# Patient Record
Sex: Female | Born: 1980 | Race: White | Hispanic: No | Marital: Single | Smoking: Never smoker
Health system: Southern US, Community
[De-identification: ages and names within clinical notes are randomized; demographics above are authoritative.]

## PROBLEM LIST (undated history)

## (undated) DIAGNOSIS — D649 Anemia, unspecified: Secondary | ICD-10-CM

## (undated) DIAGNOSIS — G8929 Other chronic pain: Secondary | ICD-10-CM

## (undated) DIAGNOSIS — R918 Other nonspecific abnormal finding of lung field: Secondary | ICD-10-CM

## (undated) DIAGNOSIS — G2581 Restless legs syndrome: Secondary | ICD-10-CM

## (undated) DIAGNOSIS — E785 Hyperlipidemia, unspecified: Secondary | ICD-10-CM

## (undated) DIAGNOSIS — IMO0002 Reserved for concepts with insufficient information to code with codable children: Secondary | ICD-10-CM

## (undated) DIAGNOSIS — F909 Attention-deficit hyperactivity disorder, unspecified type: Secondary | ICD-10-CM

## (undated) DIAGNOSIS — M549 Dorsalgia, unspecified: Secondary | ICD-10-CM

## (undated) DIAGNOSIS — K589 Irritable bowel syndrome without diarrhea: Secondary | ICD-10-CM

## (undated) DIAGNOSIS — K219 Gastro-esophageal reflux disease without esophagitis: Secondary | ICD-10-CM

## (undated) DIAGNOSIS — G459 Transient cerebral ischemic attack, unspecified: Secondary | ICD-10-CM

## (undated) DIAGNOSIS — IMO0001 Reserved for inherently not codable concepts without codable children: Secondary | ICD-10-CM

## (undated) DIAGNOSIS — M542 Cervicalgia: Secondary | ICD-10-CM

## (undated) HISTORY — PX: KIDNEY STONE SURGERY: SHX686

## (undated) HISTORY — PX: OTHER SURGICAL HISTORY: SHX169

## (undated) HISTORY — PX: TUBAL LIGATION: SHX77

## (undated) HISTORY — PX: CHOLECYSTECTOMY: SHX55

---

## 2011-06-07 ENCOUNTER — Emergency Department (HOSPITAL_BASED_OUTPATIENT_CLINIC_OR_DEPARTMENT_OTHER)
Admission: EM | Admit: 2011-06-07 | Discharge: 2011-06-07 | Disposition: A | Discharge status: Home / Self Care | Payer: Medicaid Other | Attending: Emergency Medicine | Admitting: Emergency Medicine

## 2011-06-07 ENCOUNTER — Other Ambulatory Visit: Payer: Self-pay

## 2011-06-07 ENCOUNTER — Encounter (HOSPITAL_BASED_OUTPATIENT_CLINIC_OR_DEPARTMENT_OTHER): Payer: Self-pay | Admitting: *Deleted

## 2011-06-07 DIAGNOSIS — K589 Irritable bowel syndrome without diarrhea: Secondary | ICD-10-CM | POA: Insufficient documentation

## 2011-06-07 DIAGNOSIS — G8929 Other chronic pain: Secondary | ICD-10-CM | POA: Insufficient documentation

## 2011-06-07 DIAGNOSIS — J45909 Unspecified asthma, uncomplicated: Secondary | ICD-10-CM | POA: Insufficient documentation

## 2011-06-07 DIAGNOSIS — Z79899 Other long term (current) drug therapy: Secondary | ICD-10-CM | POA: Insufficient documentation

## 2011-06-07 DIAGNOSIS — IMO0002 Reserved for concepts with insufficient information to code with codable children: Secondary | ICD-10-CM | POA: Insufficient documentation

## 2011-06-07 DIAGNOSIS — F988 Other specified behavioral and emotional disorders with onset usually occurring in childhood and adolescence: Secondary | ICD-10-CM | POA: Insufficient documentation

## 2011-06-07 DIAGNOSIS — R55 Syncope and collapse: Secondary | ICD-10-CM | POA: Insufficient documentation

## 2011-06-07 DIAGNOSIS — K219 Gastro-esophageal reflux disease without esophagitis: Secondary | ICD-10-CM | POA: Insufficient documentation

## 2011-06-07 DIAGNOSIS — R42 Dizziness and giddiness: Secondary | ICD-10-CM | POA: Insufficient documentation

## 2011-06-07 HISTORY — DX: Irritable bowel syndrome, unspecified: K58.9

## 2011-06-07 HISTORY — DX: Anemia, unspecified: D64.9

## 2011-06-07 HISTORY — DX: Reserved for concepts with insufficient information to code with codable children: IMO0002

## 2011-06-07 HISTORY — DX: Reserved for inherently not codable concepts without codable children: IMO0001

## 2011-06-07 HISTORY — DX: Hyperlipidemia, unspecified: E78.5

## 2011-06-07 HISTORY — DX: Other chronic pain: G89.29

## 2011-06-07 HISTORY — DX: Cervicalgia: M54.2

## 2011-06-07 HISTORY — DX: Dorsalgia, unspecified: M54.9

## 2011-06-07 HISTORY — DX: Restless legs syndrome: G25.81

## 2011-06-07 HISTORY — DX: Gastro-esophageal reflux disease without esophagitis: K21.9

## 2011-06-07 HISTORY — DX: Other nonspecific abnormal finding of lung field: R91.8

## 2011-06-07 HISTORY — DX: Attention-deficit hyperactivity disorder, unspecified type: F90.9

## 2011-06-07 LAB — URINE MICROSCOPIC-ADD ON

## 2011-06-07 LAB — URINALYSIS, ROUTINE W REFLEX MICROSCOPIC
Bilirubin Urine: NEGATIVE
Protein, ur: NEGATIVE mg/dL
Urobilinogen, UA: 1 mg/dL (ref 0.0–1.0)

## 2011-06-07 MED ORDER — SODIUM CHLORIDE 0.9 % IV BOLUS (SEPSIS)
1000.0000 mL | Freq: Once | INTRAVENOUS | Status: AC
  Administered 2011-06-07: 1000 mL via INTRAVENOUS

## 2011-06-07 NOTE — ED Notes (Signed)
Secondary Assessment- Pt reports she was recently admitted and d/c'd from [facility] for a syncopal episode.  She was at a follow up appointment with PMD today and had a near syncopal episode.  She reports dizziness when standing, generalized weakness and "coolness to left foot and leg". Palpable pedal pulse noted and foot is noted to be cooler than opposite foot.  Per EMS pt was hypotensive on scene and received IVF PTA.

## 2011-06-07 NOTE — ED Provider Notes (Signed)
History     CSN: [id number]  Arrival date & time 06/07/11  1656   First MD Initiated Contact with Patient 06/07/11 1729      Chief Complaint  Patient presents with  . Dizziness    (Consider location/radiation/quality/duration/timing/severity/associated sxs/prior treatment) HPI Comments: History of syncope. His had numerous tests done during recent admission to rule out the cause. Had near syncopal event at her doctor's office where her blood pressure became low. She is not orthostatic. Denies chest pain, shortness of breath, abdominal pain, nausea, vomiting  Patient is a 31 y.o. female presenting with syncope. The history is provided by the patient. No language interpreter was used.  Loss of Consciousness This is a new problem. The current episode started 1 to 2 hours ago. The problem occurs constantly. The problem has been rapidly improving. Pertinent negatives include no chest pain, no abdominal pain, no headaches and no shortness of breath. The symptoms are aggravated by nothing. The symptoms are relieved by nothing. She has tried nothing for the symptoms.    Past Medical History  Diagnosis Date  . Degenerative disc disease   . Reflux   . Attention deficit hyperactivity disorder   . Chronic back pain   . Chronic neck pain   . Asthma   . Chronic pain   . Anemia   . Restless leg syndrome   . Migraine   . Irritable bowel syndrome (IBS)   . Hyperlipemia   . Lung mass     No past surgical history on file.  No family history on file.  History  Substance Use Topics  . Smoking status: Not on file  . Smokeless tobacco: Not on file  . Alcohol Use:     OB History    Grav Para Term Preterm Abortions TAB SAB Ect Mult Living                  Review of Systems  Constitutional: Negative for fever, activity change, appetite change and fatigue.  HENT: Negative for congestion, sore throat, rhinorrhea, neck pain and neck stiffness.   Respiratory: Negative for cough and  shortness of breath.   Cardiovascular: Positive for syncope. Negative for chest pain and palpitations.  Gastrointestinal: Negative for nausea, vomiting and abdominal pain.  Genitourinary: Negative for dysuria, urgency, frequency and flank pain.  Musculoskeletal: Negative for myalgias, back pain and arthralgias.  Neurological: Positive for light-headedness. Negative for dizziness, weakness, numbness and headaches.  All other systems reviewed and are negative.    Allergies  Milk-related compounds and Latex  Home Medications   Current Outpatient Rx  Name Route Sig Dispense Refill  . ALBUTEROL SULFATE HFA 108 (90 BASE) MCG/ACT IN AERS Inhalation Inhale 2 puffs into the lungs every 4 (four) hours as needed. For cough or wheezing    . CYANOCOBALAMIN 1000 MCG/ML IJ SOLN Intramuscular Inject 1,000 mcg into the muscle every 30 (thirty) days.    . DULOXETINE HCL 20 MG PO CPEP Oral Take 20 mg by mouth 2 (two) times daily.    Marland Kitchen FLUTICASONE-SALMETEROL 250-50 MCG/DOSE IN AEPB Inhalation Inhale 1 puff into the lungs every 12 (twelve) hours.    Marland Kitchen HYDROCODONE-ACETAMINOPHEN 7.5-650 MG PO TABS Oral Take 0.5-1 tablets by mouth 3 (three) times daily as needed. For pain    . METHYLPHENIDATE HCL 10 MG PO TABS Oral Take 10 mg by mouth 3 (three) times daily as needed. For concentration    . NORTRIPTYLINE HCL 10 MG PO CAPS Oral Take 20 mg by  mouth at bedtime.    . OMEPRAZOLE 40 MG PO CPDR Oral Take 40 mg by mouth at bedtime.    . SUMATRIPTAN SUCCINATE 100 MG PO TABS Oral Take 100 mg by mouth every 2 (two) hours as needed. For migraine    . VITAMIN B-12 1000 MCG PO TABS Oral Take 2,000 mcg by mouth daily.       BP 115/99  Pulse 86  Temp(Src) 97.5 F (36.4 C) (Oral)  Resp 24  Ht 4' 11.5" (1.511 m)  Wt 94 lb (42.638 kg)  BMI 18.67 kg/m2  SpO2 100%  Physical Exam  Nursing note and vitals reviewed. Constitutional: She is oriented to person, place, and time. She appears well-developed and well-nourished. No  distress.  HENT:  Head: Normocephalic and atraumatic.  Mouth/Throat: Oropharynx is clear and moist.  Eyes: Conjunctivae and EOM are normal. Pupils are equal, round, and reactive to light.  Neck: Normal range of motion. Neck supple.  Cardiovascular: Normal rate, regular rhythm, normal heart sounds and intact distal pulses.  Exam reveals no gallop and no friction rub.   No murmur heard. Pulmonary/Chest: Effort normal and breath sounds normal. No respiratory distress.  Abdominal: Soft. Bowel sounds are normal. There is no tenderness.  Musculoskeletal: Normal range of motion. She exhibits no tenderness.  Neurological: She is alert and oriented to person, place, and time. No cranial nerve deficit.  Skin: Skin is warm and dry. No rash noted.    ED Course  Procedures (including critical care time)   Date: 06/07/2011  Rate: 75  Rhythm: normal sinus rhythm  QRS Axis: normal  Intervals: normal  ST/T Wave abnormalities: normal  Conduction Disutrbances:none  Narrative Interpretation:   Old EKG Reviewed: none available  Labs Reviewed  URINALYSIS, ROUTINE W REFLEX MICROSCOPIC - Abnormal; Notable for the following:    APPearance TURBID (*)    pH 8.5 (*)    Hgb urine dipstick LARGE (*)    Ketones, ur 15 (*)    All other components within normal limits  URINE MICROSCOPIC-ADD ON - Abnormal; Notable for the following:    Squamous Epithelial / LPF FEW (*)    Bacteria, UA FEW (*)    All other components within normal limits  PREGNANCY, URINE   No results found.   1. Vasovagal syncope       MDM  Vasovagal syncopal episode. His had a extensive workup recently. Care physician's office where she had this event. His normotensive in the emergency department. She received a liter of fluids. However workup was unremarkable. She is not orthostatic. She'll be discharged home with instructions to followup as scheduled with her doctors        Dayton Bailiff, MD 06/07/11 1818

## 2013-04-06 ENCOUNTER — Encounter (HOSPITAL_BASED_OUTPATIENT_CLINIC_OR_DEPARTMENT_OTHER): Payer: Self-pay | Admitting: Emergency Medicine

## 2013-04-06 ENCOUNTER — Emergency Department (HOSPITAL_BASED_OUTPATIENT_CLINIC_OR_DEPARTMENT_OTHER): Payer: Self-pay

## 2013-04-06 ENCOUNTER — Emergency Department (HOSPITAL_BASED_OUTPATIENT_CLINIC_OR_DEPARTMENT_OTHER)
Admission: EM | Admit: 2013-04-06 | Discharge: 2013-04-07 | Disposition: A | Discharge status: Home / Self Care | Payer: Self-pay | Attending: Emergency Medicine | Admitting: Emergency Medicine

## 2013-04-06 DIAGNOSIS — D649 Anemia, unspecified: Secondary | ICD-10-CM | POA: Insufficient documentation

## 2013-04-06 DIAGNOSIS — N201 Calculus of ureter: Secondary | ICD-10-CM | POA: Insufficient documentation

## 2013-04-06 DIAGNOSIS — K219 Gastro-esophageal reflux disease without esophagitis: Secondary | ICD-10-CM | POA: Insufficient documentation

## 2013-04-06 DIAGNOSIS — F909 Attention-deficit hyperactivity disorder, unspecified type: Secondary | ICD-10-CM | POA: Insufficient documentation

## 2013-04-06 DIAGNOSIS — Z791 Long term (current) use of non-steroidal anti-inflammatories (NSAID): Secondary | ICD-10-CM | POA: Insufficient documentation

## 2013-04-06 DIAGNOSIS — Z79899 Other long term (current) drug therapy: Secondary | ICD-10-CM | POA: Insufficient documentation

## 2013-04-06 DIAGNOSIS — Z3202 Encounter for pregnancy test, result negative: Secondary | ICD-10-CM | POA: Insufficient documentation

## 2013-04-06 DIAGNOSIS — Z91040 Latex allergy status: Secondary | ICD-10-CM | POA: Insufficient documentation

## 2013-04-06 DIAGNOSIS — Z8742 Personal history of other diseases of the female genital tract: Secondary | ICD-10-CM | POA: Insufficient documentation

## 2013-04-06 DIAGNOSIS — G43909 Migraine, unspecified, not intractable, without status migrainosus: Secondary | ICD-10-CM | POA: Insufficient documentation

## 2013-04-06 DIAGNOSIS — J45909 Unspecified asthma, uncomplicated: Secondary | ICD-10-CM | POA: Insufficient documentation

## 2013-04-06 DIAGNOSIS — E785 Hyperlipidemia, unspecified: Secondary | ICD-10-CM | POA: Insufficient documentation

## 2013-04-06 DIAGNOSIS — Z792 Long term (current) use of antibiotics: Secondary | ICD-10-CM | POA: Insufficient documentation

## 2013-04-06 DIAGNOSIS — G2581 Restless legs syndrome: Secondary | ICD-10-CM | POA: Insufficient documentation

## 2013-04-06 DIAGNOSIS — G8929 Other chronic pain: Secondary | ICD-10-CM | POA: Insufficient documentation

## 2013-04-06 DIAGNOSIS — IMO0002 Reserved for concepts with insufficient information to code with codable children: Secondary | ICD-10-CM | POA: Insufficient documentation

## 2013-04-06 DIAGNOSIS — K589 Irritable bowel syndrome without diarrhea: Secondary | ICD-10-CM | POA: Insufficient documentation

## 2013-04-06 LAB — COMPREHENSIVE METABOLIC PANEL
AST: 26 U/L (ref 0–37)
Alkaline Phosphatase: 81 U/L (ref 39–117)
CO2: 22 mEq/L (ref 19–32)
Chloride: 102 mEq/L (ref 96–112)
Creatinine, Ser: 0.8 mg/dL (ref 0.50–1.10)
GFR calc non Af Amer: >90 mL/min (ref >90)
Potassium: 3.5 mEq/L (ref 3.5–5.1)
Total Bilirubin: 0.6 mg/dL (ref 0.3–1.2)

## 2013-04-06 LAB — LIPASE, BLOOD: Lipase: 27 U/L (ref 11–59)

## 2013-04-06 LAB — URINALYSIS, ROUTINE W REFLEX MICROSCOPIC
Bilirubin Urine: NEGATIVE
Glucose, UA: NEGATIVE mg/dL
Ketones, ur: 15 mg/dL — AB
pH: 8 (ref 5.0–8.0)

## 2013-04-06 LAB — CBC WITH DIFFERENTIAL/PLATELET
Basophils Absolute: 0 10*3/uL (ref 0.0–0.1)
HCT: 39.2 % (ref 36.0–46.0)
Hemoglobin: 12.9 g/dL (ref 12.0–15.0)
Lymphocytes Relative: 33 % (ref 12–46)
Monocytes Absolute: 0.7 10*3/uL (ref 0.1–1.0)
Monocytes Relative: 9 % (ref 3–12)
Neutro Abs: 4.1 10*3/uL (ref 1.7–7.7)
RDW: 13 % (ref 11.5–15.5)
WBC: 7.6 10*3/uL (ref 4.0–10.5)

## 2013-04-06 LAB — URINE MICROSCOPIC-ADD ON

## 2013-04-06 MED ORDER — ONDANSETRON HCL 4 MG/2ML IJ SOLN
4.0000 mg | Freq: Once | INTRAMUSCULAR | Status: AC
  Administered 2013-04-06: 4 mg via INTRAVENOUS
  Filled 2013-04-06: qty 2

## 2013-04-06 MED ORDER — SODIUM CHLORIDE 0.9 % IV BOLUS (SEPSIS)
1000.0000 mL | Freq: Once | INTRAVENOUS | Status: AC
  Administered 2013-04-06: 1000 mL via INTRAVENOUS

## 2013-04-06 MED ORDER — MORPHINE SULFATE 4 MG/ML IJ SOLN
4.0000 mg | INTRAMUSCULAR | Status: DC | PRN
  Administered 2013-04-06: 4 mg via INTRAVENOUS
  Filled 2013-04-06: qty 1

## 2013-04-06 NOTE — ED Notes (Signed)
I have requested Emergency Department records from [facility].

## 2013-04-06 NOTE — ED Provider Notes (Addendum)
CSN: [id number]     Arrival date & time 04/06/13  1929 History  This chart was scribed for Roney Marion, MD by Bennett Scrape, ED Scribe. This patient was seen in room MH07/MH07 and the patient's care was started at 9:41 PM.   Chief Complaint  Patient presents with  . Flank Pain    The history is provided by the patient. No language interpreter was used.    HPI Comments: Chinita Schimpf is a 32 y.o. female who presents to the Emergency Department complaining of persistent right mid abdominal pain that has been worsening since it's onset since yesterday morning. The pain worsens with movement and she reports that she has been laying around secondary to the pain. She was seen last night at [facility] and was told that "something was wrong with my intestines" on CT scan. She was also informed that she had microscopic blood in her urine, low potassium and low calcium. She denies any hematuria last night. She was diagnosed with non-specific abdominal pain and was given Tramadol and Naprosyn with no relief. She denies any cough, fevers, hematuria or dysuria. She denies any prior episodes of the same. She has a h/o kidney stones with prior kidney stone removals but denies any similarities. She denies any prior episodes of hematochezia or melena. Pt states that she was diagnosed with Crohn's disease after passing out 2 years ago. She reports a prior colonoscopy of an unknown date that was normal. She denies any family h/o Crohn's disease or ulcerative colitis. She denies having a h/o menorrhea or other female reproductive problems other than ovarian cysts. Last ovarian cyst was in 2002. She states that she is due for her menses within the next week. LNMP was last month. She has a h/o cholecystectomy but denies a prior appendectomy.   Past Medical History  Diagnosis Date  . Degenerative disc disease   . Reflux   . Attention deficit hyperactivity disorder   . Chronic back pain   . Chronic neck pain    . Asthma   . Chronic pain   . Anemia   . Restless leg syndrome   . Migraine   . Irritable bowel syndrome (IBS)   . Hyperlipemia   . Lung mass    History reviewed. No pertinent past surgical history. History reviewed. No pertinent family history. History  Substance Use Topics  . Smoking status: Never Smoker   . Smokeless tobacco: Not on file  . Alcohol Use: No   No OB history provided.  Review of Systems  Constitutional: Negative for fever, chills, diaphoresis, appetite change and fatigue.  HENT: Negative for mouth sores, sore throat and trouble swallowing.   Eyes: Negative for visual disturbance.  Respiratory: Negative for cough, chest tightness, shortness of breath and wheezing.   Cardiovascular: Negative for chest pain.  Gastrointestinal: Positive for abdominal pain. Negative for nausea, vomiting, diarrhea and abdominal distention.  Endocrine: Negative for polydipsia, polyphagia and polyuria.  Genitourinary: Negative for dysuria, frequency and hematuria.  Musculoskeletal: Negative for gait problem.  Skin: Negative for color change, pallor and rash.  Neurological: Negative for dizziness, syncope, light-headedness and headaches.  Hematological: Does not bruise/bleed easily.  Psychiatric/Behavioral: Negative for behavioral problems and confusion.    Allergies  Milk-related compounds and Latex  Home Medications   Current Outpatient Rx  Name  Route  Sig  Dispense  Refill  . albuterol (PROVENTIL HFA;VENTOLIN HFA) 108 (90 BASE) MCG/ACT inhaler   Inhalation   Inhale 2 puffs into  the lungs every 4 (four) hours as needed. For cough or wheezing         . ciprofloxacin (CIPRO) 500 MG tablet   Oral   Take 1 tablet (500 mg total) by mouth every 12 (twelve) hours.   10 tablet   0   . cyanocobalamin (,VITAMIN B-12,) 1000 MCG/ML injection   Intramuscular   Inject 1,000 mcg into the muscle every 30 (thirty) days.         . DULoxetine (CYMBALTA) 20 MG capsule   Oral    Take 20 mg by mouth 2 (two) times daily.         . Fluticasone-Salmeterol (ADVAIR) 250-50 MCG/DOSE AEPB   Inhalation   Inhale 1 puff into the lungs every 12 (twelve) hours.         . hydrocodone-acetaminophen (LORCET PLUS) 7.5-650 MG per tablet   Oral   Take 0.5-1 tablets by mouth 3 (three) times daily as needed. For pain         . ibuprofen (ADVIL,MOTRIN) 800 MG tablet   Oral   Take 1 tablet (800 mg total) by mouth 3 (three) times daily.   21 tablet   0   . methylphenidate (RITALIN) 10 MG tablet   Oral   Take 10 mg by mouth 3 (three) times daily as needed. For concentration         . nortriptyline (PAMELOR) 10 MG capsule   Oral   Take 20 mg by mouth at bedtime.         Marland Kitchen omeprazole (PRILOSEC) 40 MG capsule   Oral   Take 40 mg by mouth at bedtime.         . ondansetron (ZOFRAN ODT) 4 MG disintegrating tablet   Oral   Take 1 tablet (4 mg total) by mouth every 8 (eight) hours as needed for nausea.   20 tablet   0   . oxyCODONE-acetaminophen (PERCOCET/ROXICET) 5-325 MG per tablet   Oral   Take 2 tablets by mouth every 4 (four) hours as needed.   6 tablet   0   . SUMAtriptan (IMITREX) 100 MG tablet   Oral   Take 100 mg by mouth every 2 (two) hours as needed. For migraine         . tamsulosin (FLOMAX) 0.4 MG CAPS capsule   Oral   Take 1 capsule (0.4 mg total) by mouth daily.   7 capsule   0   . vitamin B-12 (CYANOCOBALAMIN) 1000 MCG tablet   Oral   Take 2,000 mcg by mouth daily.           Triage Vitals: BP 113/73  Pulse 88  Temp(Src) 98.8 F (37.1 C) (Oral)  Resp 18  Ht 4\' 11"  (1.499 m)  Wt 112 lb (50.803 kg)  BMI 22.61 kg/m2  SpO2 100%  LMP 03/20/2013  Physical Exam  Nursing note and vitals reviewed. Constitutional: She is oriented to person, place, and time. She appears well-developed and well-nourished. No distress.  HENT:  Head: Normocephalic and atraumatic.  Eyes: Conjunctivae are normal. Pupils are equal, round, and reactive to  light. No scleral icterus.  Neck: Normal range of motion. Neck supple. No thyromegaly present.  Cardiovascular: Normal rate and regular rhythm.  Exam reveals no gallop and no friction rub.   No murmur heard. Pulmonary/Chest: Effort normal and breath sounds normal. No respiratory distress. She has no wheezes. She has no rales.  Abdominal: Soft. Bowel sounds are normal. She exhibits no distension. There  is tenderness (tender throughout most of right abdomen ). There is no rebound and no guarding.  Musculoskeletal: Normal range of motion.  Neurological: She is alert and oriented to person, place, and time.  Skin: Skin is warm and dry. No rash noted.  Psychiatric: She has a normal mood and affect. Her behavior is normal.    ED Course  Procedures (including critical care time)  DIAGNOSTIC STUDIES: Oxygen Saturation is 100% on room air, normal by my interpretation.    COORDINATION OF CARE: 9:45 PM-Advised pt that work up tonight may not show the cause of the problems. Discussed treatment plan which includes review of records from ED visit last night and a repeat CT scan of the abdomen with pt at bedside and pt agreed to plan.   Labs Review Labs Reviewed  SEDIMENTATION RATE - Abnormal; Notable for the following:    Sed Rate 29 (*)    All other components within normal limits  URINALYSIS, ROUTINE W REFLEX MICROSCOPIC - Abnormal; Notable for the following:    APPearance CLOUDY (*)    Hgb urine dipstick MODERATE (*)    Ketones, ur 15 (*)    Leukocytes, UA SMALL (*)    All other components within normal limits  URINE MICROSCOPIC-ADD ON - Abnormal; Notable for the following:    Squamous Epithelial / LPF FEW (*)    Bacteria, UA MANY (*)    All other components within normal limits  URINE CULTURE  CBC WITH DIFFERENTIAL  COMPREHENSIVE METABOLIC PANEL  LIPASE, BLOOD  HCG, SERUM, QUALITATIVE   Imaging Review Ct Abdomen Pelvis Wo Contrast  04/06/2013   CLINICAL DATA:  Right flank and rib  pain.  EXAM: CT ABDOMEN AND PELVIS WITHOUT CONTRAST  TECHNIQUE: Multidetector CT imaging of the abdomen and pelvis was performed following the standard protocol without intravenous contrast.  COMPARISON:  04/06/2013  FINDINGS: There is a stable linear density along the periphery the right middle lobe on image 2. This has been stable since 02/01/2011 and likely a benign finding. Otherwise, the lung bases are clear. No evidence for free air.  There is no evidence for right kidney stones or significant right hydronephrosis. However, there is a 5 mm calcification in the region of the distal ureter and near the right adnexal structures. There was not a calcification in this region on the exam from 03/12/2013 and this is suggestive for a nonobstructive right ureter stone. There may be 2 punctate calcifications in left kidney which are only seen on the coronal images. No evidence for left hydronephrosis.  The gallbladder has been removed. Normal appearance of the liver and spleen. Small calculi in the splenic consistent with old granulomatous disease. Normal appearance of the pancreas, adrenal glands and stomach. The common bile duct measures up to 9 mm and similar to previous exams. There is no significant abdominal or pelvic lymphadenopathy.  There may be a small amount of free fluid in the pelvis. Stable configuration of the uterus and adnexal structures. Limited evaluation of the uterus and adnexal structures on this noncontrast examination. The appendix is partially visualized without gross abnormality. No acute bone abnormality. Chronic pars defects at L5.  IMPRESSION: There is a 5 mm calcification in the region of the distal right ureter. This appears to represent a nonobstructive right ureter stone. There is no significant right hydronephrosis.  Question punctate stones in the left kidney, seen only on the coronal images. No evidence for hydronephrosis.  A trace amount of free fluid in the pelvis  is likely  physiologic.   Electronically Signed   By: Richarda Overlie M.D.   On: 04/06/2013 23:47    EKG Interpretation   None       MDM   1. Ureteral stone    CT shows 5 mm right distal ureteral stone. She has trace of blood and red blood cells her urine. Plan outpatient treatment. Urological followup. Given IV fluids and pain medications here symptoms are improving. Prescription for Flomax, Percocet, ibuprofen, Zofran.  I personally performed the services described in this documentation, which was scribed in my presence. The recorded information has been reviewed and is accurate.    Roney Marion, MD 04/07/13 0001  Roney Marion, MD 04/07/13 [phone]

## 2013-04-06 NOTE — ED Notes (Signed)
Pt reports right flank and rib area pain onset x 1 day seen at [facility] yesterday for same was told she had blood in urine. Pain increases with movement

## 2013-04-07 MED ORDER — OXYCODONE-ACETAMINOPHEN 5-325 MG PO TABS: 2.0000 | ORAL_TABLET | ORAL | Status: DC | PRN

## 2013-04-07 MED ORDER — IBUPROFEN 800 MG PO TABS: 800.0000 mg | ORAL_TABLET | Freq: Three times a day (TID) | ORAL | Status: DC

## 2013-04-07 MED ORDER — CIPROFLOXACIN HCL 500 MG PO TABS: 500.0000 mg | ORAL_TABLET | Freq: Two times a day (BID) | ORAL | Status: AC

## 2013-04-07 MED ORDER — TAMSULOSIN HCL 0.4 MG PO CAPS
0.4000 mg | ORAL_CAPSULE | Freq: Once | ORAL | Status: AC
  Administered 2013-04-07: 0.4 mg via ORAL
  Filled 2013-04-07: qty 1

## 2013-04-07 MED ORDER — HYDROMORPHONE HCL PF 1 MG/ML IJ SOLN
1.0000 mg | Freq: Once | INTRAMUSCULAR | Status: AC
  Administered 2013-04-07: 1 mg via INTRAVENOUS
  Filled 2013-04-07: qty 1

## 2013-04-07 MED ORDER — TAMSULOSIN HCL 0.4 MG PO CAPS: 0.4000 mg | ORAL_CAPSULE | Freq: Every day | ORAL | Status: AC

## 2013-04-07 MED ORDER — ONDANSETRON 4 MG PO TBDP: 4.0000 mg | ORAL_TABLET | Freq: Three times a day (TID) | ORAL | Status: AC | PRN

## 2013-04-07 NOTE — ED Provider Notes (Signed)
Patient return to the emergency department with a prescription for oxycodone that she was unable to fill because Dr. Fayrene Fearing forgot to sign the prescription. I wrote another prescription for the patient under my name and discarded the previous prescription written by Dr. Fayrene Fearing.  Geoffery Lyons, MD 04/07/13 1034

## 2013-04-08 LAB — URINE CULTURE

## 2013-04-10 ENCOUNTER — Emergency Department (HOSPITAL_BASED_OUTPATIENT_CLINIC_OR_DEPARTMENT_OTHER)
Admission: EM | Admit: 2013-04-10 | Discharge: 2013-04-11 | Disposition: A | Discharge status: Home / Self Care | Payer: Medicaid Other | Attending: Emergency Medicine | Admitting: Emergency Medicine

## 2013-04-10 ENCOUNTER — Encounter (HOSPITAL_BASED_OUTPATIENT_CLINIC_OR_DEPARTMENT_OTHER): Payer: Self-pay | Admitting: Emergency Medicine

## 2013-04-10 DIAGNOSIS — F909 Attention-deficit hyperactivity disorder, unspecified type: Secondary | ICD-10-CM | POA: Insufficient documentation

## 2013-04-10 DIAGNOSIS — G43909 Migraine, unspecified, not intractable, without status migrainosus: Secondary | ICD-10-CM | POA: Insufficient documentation

## 2013-04-10 DIAGNOSIS — Z8639 Personal history of other endocrine, nutritional and metabolic disease: Secondary | ICD-10-CM | POA: Insufficient documentation

## 2013-04-10 DIAGNOSIS — Z79899 Other long term (current) drug therapy: Secondary | ICD-10-CM | POA: Insufficient documentation

## 2013-04-10 DIAGNOSIS — G8929 Other chronic pain: Secondary | ICD-10-CM | POA: Insufficient documentation

## 2013-04-10 DIAGNOSIS — Z91040 Latex allergy status: Secondary | ICD-10-CM | POA: Insufficient documentation

## 2013-04-10 DIAGNOSIS — J45909 Unspecified asthma, uncomplicated: Secondary | ICD-10-CM | POA: Insufficient documentation

## 2013-04-10 DIAGNOSIS — G2581 Restless legs syndrome: Secondary | ICD-10-CM | POA: Insufficient documentation

## 2013-04-10 DIAGNOSIS — K219 Gastro-esophageal reflux disease without esophagitis: Secondary | ICD-10-CM | POA: Insufficient documentation

## 2013-04-10 DIAGNOSIS — Z862 Personal history of diseases of the blood and blood-forming organs and certain disorders involving the immune mechanism: Secondary | ICD-10-CM | POA: Insufficient documentation

## 2013-04-10 DIAGNOSIS — IMO0002 Reserved for concepts with insufficient information to code with codable children: Secondary | ICD-10-CM | POA: Insufficient documentation

## 2013-04-10 DIAGNOSIS — N23 Unspecified renal colic: Secondary | ICD-10-CM

## 2013-04-10 LAB — URINALYSIS, ROUTINE W REFLEX MICROSCOPIC
Bilirubin Urine: NEGATIVE
Glucose, UA: NEGATIVE mg/dL
Ketones, ur: NEGATIVE mg/dL
Nitrite: NEGATIVE
Protein, ur: NEGATIVE mg/dL
Specific Gravity, Urine: 1.012 (ref 1.005–1.030)
Urobilinogen, UA: 0.2 mg/dL (ref 0.0–1.0)
pH: 5 (ref 5.0–8.0)

## 2013-04-10 LAB — URINE MICROSCOPIC-ADD ON

## 2013-04-10 NOTE — ED Notes (Signed)
Pt c/o right flank pain seen here 12/6 for same DX kidney stone

## 2013-04-11 MED ORDER — ONDANSETRON 4 MG PO TBDP
4.0000 mg | ORAL_TABLET | Freq: Once | ORAL | Status: AC
  Administered 2013-04-11: 4 mg via ORAL
  Filled 2013-04-11: qty 1

## 2013-04-11 MED ORDER — HYDROMORPHONE HCL PF 2 MG/ML IJ SOLN
2.0000 mg | Freq: Once | INTRAMUSCULAR | Status: AC
  Administered 2013-04-11: 2 mg via INTRAMUSCULAR
  Filled 2013-04-11: qty 1

## 2013-04-11 MED ORDER — OXYCODONE-ACETAMINOPHEN 5-325 MG PO TABS: 1.0000 | ORAL_TABLET | ORAL | Status: AC | PRN

## 2013-04-11 NOTE — ED Provider Notes (Signed)
CSN: [id number]     Arrival date & time 04/10/13  2249 History   First MD Initiated Contact with Patient 04/11/13 0044     Chief Complaint  Patient presents with  . Flank Pain   (Consider location/radiation/quality/duration/timing/severity/associated sxs/prior Treatment) HPI This is a 32 year old female who was diagnosed with a 5 mm right distal ureteral stone 4 days ago. She was discharged with a prescription for 6 oxycodone tablets. She has been unable to follow up with urology due to lack of insurance. She is here with continued severe pain in the right flank. She has been taking ibuprofen with partial relief. She is taking meclizine for nausea, with success, because she cannot afford ondansetron. She has not had fever. She has not noticed any gross hematuria.  Past Medical History  Diagnosis Date  . Degenerative disc disease   . Reflux   . Attention deficit hyperactivity disorder   . Chronic back pain   . Chronic neck pain   . Asthma   . Chronic pain   . Anemia   . Restless leg syndrome   . Migraine   . Irritable bowel syndrome (IBS)   . Hyperlipemia   . Lung mass    History reviewed. No pertinent past surgical history. History reviewed. No pertinent family history. History  Substance Use Topics  . Smoking status: Never Smoker   . Smokeless tobacco: Not on file  . Alcohol Use: No   OB History   Grav Para Term Preterm Abortions TAB SAB Ect Mult Living                 Review of Systems  All other systems reviewed and are negative.    Allergies  Milk-related compounds and Latex  Home Medications   Current Outpatient Rx  Name  Route  Sig  Dispense  Refill  . albuterol (PROVENTIL HFA;VENTOLIN HFA) 108 (90 BASE) MCG/ACT inhaler   Inhalation   Inhale 2 puffs into the lungs every 4 (four) hours as needed. For cough or wheezing         . ciprofloxacin (CIPRO) 500 MG tablet   Oral   Take 1 tablet (500 mg total) by mouth every 12 (twelve) hours.   10 tablet    0   . cyanocobalamin (,VITAMIN B-12,) 1000 MCG/ML injection   Intramuscular   Inject 1,000 mcg into the muscle every 30 (thirty) days.         . DULoxetine (CYMBALTA) 20 MG capsule   Oral   Take 20 mg by mouth 2 (two) times daily.         . Fluticasone-Salmeterol (ADVAIR) 250-50 MCG/DOSE AEPB   Inhalation   Inhale 1 puff into the lungs every 12 (twelve) hours.         . hydrocodone-acetaminophen (LORCET PLUS) 7.5-650 MG per tablet   Oral   Take 0.5-1 tablets by mouth 3 (three) times daily as needed. For pain         . ibuprofen (ADVIL,MOTRIN) 800 MG tablet   Oral   Take 1 tablet (800 mg total) by mouth 3 (three) times daily.   21 tablet   0   . methylphenidate (RITALIN) 10 MG tablet   Oral   Take 10 mg by mouth 3 (three) times daily as needed. For concentration         . nortriptyline (PAMELOR) 10 MG capsule   Oral   Take 20 mg by mouth at bedtime.         Marland Kitchen  omeprazole (PRILOSEC) 40 MG capsule   Oral   Take 40 mg by mouth at bedtime.         . ondansetron (ZOFRAN ODT) 4 MG disintegrating tablet   Oral   Take 1 tablet (4 mg total) by mouth every 8 (eight) hours as needed for nausea.   20 tablet   0   . oxyCODONE-acetaminophen (PERCOCET/ROXICET) 5-325 MG per tablet   Oral   Take 2 tablets by mouth every 4 (four) hours as needed for severe pain.   6 tablet   0   . SUMAtriptan (IMITREX) 100 MG tablet   Oral   Take 100 mg by mouth every 2 (two) hours as needed. For migraine         . tamsulosin (FLOMAX) 0.4 MG CAPS capsule   Oral   Take 1 capsule (0.4 mg total) by mouth daily.   7 capsule   0   . vitamin B-12 (CYANOCOBALAMIN) 1000 MCG tablet   Oral   Take 2,000 mcg by mouth daily.           BP 116/76  Pulse 100  Temp(Src) 97.4 F (36.3 C)  Resp 16  Ht 4\' 11"  (1.499 m)  Wt 112 lb (50.803 kg)  BMI 22.61 kg/m2  SpO2 100%  LMP 03/20/2013  Physical Exam General: Well-developed, well-nourished female in no acute distress; appearance  consistent with age of record HENT: normocephalic; atraumatic Eyes: pupils equal, round and reactive to light; extraocular muscles intact Neck: supple Heart: regular rate and rhythm; no murmurs, rubs or gallops Lungs: clear to auscultation bilaterally Abdomen: soft; nondistended; nontender; no masses or hepatosplenomegaly; bowel sounds present GU: Right CVA tenderness Extremities: No deformity; full range of motion; pulses normal; no edema Neurologic: Awake, alert and oriented; motor function intact in all extremities and symmetric; no facial droop Skin: Warm and dry Psychiatric: Normal mood and affect    ED Course  Procedures (including critical care time) Nursing notes and vitals signs, including pulse oximetry, reviewed.  Summary of this visit's results, reviewed by myself:  Labs:  Results for orders placed during the hospital encounter of 04/10/13 (from the past 24 hour(s))  URINALYSIS, ROUTINE W REFLEX MICROSCOPIC     Status: Abnormal   Collection Time    04/10/13 11:05 PM      Result Value Range   Color, Urine YELLOW  YELLOW   APPearance CLOUDY (*) CLEAR   Specific Gravity, Urine 1.012  1.005 - 1.030   pH 5.0  5.0 - 8.0   Glucose, UA NEGATIVE  NEGATIVE mg/dL   Hgb urine dipstick MODERATE (*) NEGATIVE   Bilirubin Urine NEGATIVE  NEGATIVE   Ketones, ur NEGATIVE  NEGATIVE mg/dL   Protein, ur NEGATIVE  NEGATIVE mg/dL   Urobilinogen, UA 0.2  0.0 - 1.0 mg/dL   Nitrite NEGATIVE  NEGATIVE   Leukocytes, UA NEGATIVE  NEGATIVE  URINE MICROSCOPIC-ADD ON     Status: Abnormal   Collection Time    04/10/13 11:05 PM      Result Value Range   Squamous Epithelial / LPF FEW (*) RARE   WBC, UA 0-2  <3 WBC/hpf   RBC / HPF 7-10  <3 RBC/hpf   Bacteria, UA RARE  RARE   Casts HYALINE CASTS (*) NEGATIVE        Hanley Seamen, MD 04/11/13 [phone]

## 2014-12-26 ENCOUNTER — Other Ambulatory Visit (HOSPITAL_COMMUNITY): Payer: Self-pay | Admitting: Specialist

## 2014-12-26 DIAGNOSIS — N736 Female pelvic peritoneal adhesions (postinfective): Secondary | ICD-10-CM

## 2014-12-30 ENCOUNTER — Telehealth (HOSPITAL_COMMUNITY): Payer: Self-pay | Admitting: Specialist

## 2015-01-01 ENCOUNTER — Emergency Department (HOSPITAL_BASED_OUTPATIENT_CLINIC_OR_DEPARTMENT_OTHER)
Admission: EM | Admit: 2015-01-01 | Discharge: 2015-01-01 | Disposition: A | Discharge status: Home / Self Care | Payer: Medicaid Other | Attending: Emergency Medicine | Admitting: Emergency Medicine

## 2015-01-01 ENCOUNTER — Emergency Department (HOSPITAL_BASED_OUTPATIENT_CLINIC_OR_DEPARTMENT_OTHER): Payer: Medicaid Other

## 2015-01-01 ENCOUNTER — Encounter (HOSPITAL_BASED_OUTPATIENT_CLINIC_OR_DEPARTMENT_OTHER): Payer: Self-pay | Admitting: *Deleted

## 2015-01-01 DIAGNOSIS — Z8639 Personal history of other endocrine, nutritional and metabolic disease: Secondary | ICD-10-CM | POA: Insufficient documentation

## 2015-01-01 DIAGNOSIS — Z3202 Encounter for pregnancy test, result negative: Secondary | ICD-10-CM | POA: Insufficient documentation

## 2015-01-01 DIAGNOSIS — K219 Gastro-esophageal reflux disease without esophagitis: Secondary | ICD-10-CM | POA: Diagnosis not present

## 2015-01-01 DIAGNOSIS — J45909 Unspecified asthma, uncomplicated: Secondary | ICD-10-CM | POA: Diagnosis not present

## 2015-01-01 DIAGNOSIS — R109 Unspecified abdominal pain: Secondary | ICD-10-CM

## 2015-01-01 DIAGNOSIS — Z8739 Personal history of other diseases of the musculoskeletal system and connective tissue: Secondary | ICD-10-CM | POA: Insufficient documentation

## 2015-01-01 DIAGNOSIS — Z91040 Latex allergy status: Secondary | ICD-10-CM | POA: Insufficient documentation

## 2015-01-01 DIAGNOSIS — R0789 Other chest pain: Secondary | ICD-10-CM | POA: Diagnosis not present

## 2015-01-01 DIAGNOSIS — G8929 Other chronic pain: Secondary | ICD-10-CM | POA: Diagnosis not present

## 2015-01-01 DIAGNOSIS — Z862 Personal history of diseases of the blood and blood-forming organs and certain disorders involving the immune mechanism: Secondary | ICD-10-CM | POA: Diagnosis not present

## 2015-01-01 DIAGNOSIS — F909 Attention-deficit hyperactivity disorder, unspecified type: Secondary | ICD-10-CM | POA: Diagnosis not present

## 2015-01-01 DIAGNOSIS — G43909 Migraine, unspecified, not intractable, without status migrainosus: Secondary | ICD-10-CM | POA: Insufficient documentation

## 2015-01-01 DIAGNOSIS — Z79899 Other long term (current) drug therapy: Secondary | ICD-10-CM | POA: Diagnosis not present

## 2015-01-01 DIAGNOSIS — R1031 Right lower quadrant pain: Secondary | ICD-10-CM | POA: Insufficient documentation

## 2015-01-01 LAB — CBC WITH DIFFERENTIAL/PLATELET
BASOS PCT: 0 % (ref 0–1)
Basophils Absolute: 0 10*3/uL (ref 0.0–0.1)
Eosinophils Absolute: 0.2 10*3/uL (ref 0.0–0.7)
Eosinophils Relative: 2 % (ref 0–5)
HCT: 38 % (ref 36.0–46.0)
Hemoglobin: 12.2 g/dL (ref 12.0–15.0)
Lymphocytes Relative: 20 % (ref 12–46)
Lymphs Abs: 2 10*3/uL (ref 0.7–4.0)
MCH: 26.6 pg (ref 26.0–34.0)
MCHC: 32.1 g/dL (ref 30.0–36.0)
MCV: 82.8 fL (ref 78.0–100.0)
MONO ABS: 0.7 10*3/uL (ref 0.1–1.0)
MONOS PCT: 7 % (ref 3–12)
Neutro Abs: 7.1 10*3/uL (ref 1.7–7.7)
Neutrophils Relative %: 71 % (ref 43–77)
Platelets: 371 10*3/uL (ref 150–400)
RBC: 4.59 MIL/uL (ref 3.87–5.11)
RDW: 14.6 % (ref 11.5–15.5)
WBC: 10 10*3/uL (ref 4.0–10.5)

## 2015-01-01 LAB — URINE MICROSCOPIC-ADD ON

## 2015-01-01 LAB — URINALYSIS, ROUTINE W REFLEX MICROSCOPIC
Bilirubin Urine: NEGATIVE
GLUCOSE, UA: NEGATIVE mg/dL
HGB URINE DIPSTICK: NEGATIVE
KETONES UR: NEGATIVE mg/dL
Nitrite: NEGATIVE
Protein, ur: NEGATIVE mg/dL
Specific Gravity, Urine: 1.012 (ref 1.005–1.030)
Urobilinogen, UA: 1 mg/dL (ref 0.0–1.0)
pH: 8.5 — ABNORMAL HIGH (ref 5.0–8.0)

## 2015-01-01 LAB — COMPREHENSIVE METABOLIC PANEL
ALT: 18 U/L (ref 14–54)
ANION GAP: 10 (ref 5–15)
AST: 23 U/L (ref 15–41)
Albumin: 4.1 g/dL (ref 3.5–5.0)
Alkaline Phosphatase: 88 U/L (ref 38–126)
BILIRUBIN TOTAL: 0.5 mg/dL (ref 0.3–1.2)
BUN: 8 mg/dL (ref 6–20)
CO2: 24 mmol/L (ref 22–32)
Calcium: 10 mg/dL (ref 8.9–10.3)
Chloride: 105 mmol/L (ref 101–111)
Creatinine, Ser: 0.73 mg/dL (ref 0.44–1.00)
GFR calc Af Amer: >60 mL/min (ref >60)
GFR calc non Af Amer: >60 mL/min (ref >60)
Glucose, Bld: 94 mg/dL (ref 65–99)
POTASSIUM: 3.3 mmol/L — AB (ref 3.5–5.1)
Sodium: 139 mmol/L (ref 135–145)
TOTAL PROTEIN: 8.1 g/dL (ref 6.5–8.1)

## 2015-01-01 LAB — PREGNANCY, URINE: PREG TEST UR: NEGATIVE

## 2015-01-01 LAB — TROPONIN I: Troponin I: <0.03 ng/mL (ref <0.031)

## 2015-01-01 LAB — D-DIMER, QUANTITATIVE (NOT AT ARMC): D-Dimer, Quant: 0.78 ug/mL-FEU — ABNORMAL HIGH (ref 0.00–0.48)

## 2015-01-01 MED ORDER — IBUPROFEN 800 MG PO TABS: 800.0000 mg | ORAL_TABLET | Freq: Three times a day (TID) | ORAL | Status: AC

## 2015-01-01 MED ORDER — HYDROCODONE-ACETAMINOPHEN 5-325 MG PO TABS: 1.0000 | ORAL_TABLET | ORAL | Status: AC | PRN

## 2015-01-01 MED ORDER — ONDANSETRON HCL 4 MG PO TABS: 4.0000 mg | ORAL_TABLET | Freq: Four times a day (QID) | ORAL | Status: AC

## 2015-01-01 MED ORDER — IOHEXOL 350 MG/ML SOLN
100.0000 mL | Freq: Once | INTRAVENOUS | Status: AC | PRN
  Administered 2015-01-01: 100 mL via INTRAVENOUS

## 2015-01-01 MED ORDER — MORPHINE SULFATE (PF) 4 MG/ML IV SOLN
4.0000 mg | Freq: Once | INTRAVENOUS | Status: AC
  Administered 2015-01-01: 4 mg via INTRAVENOUS
  Filled 2015-01-01: qty 1

## 2015-01-01 MED ORDER — ONDANSETRON HCL 4 MG/2ML IJ SOLN
4.0000 mg | Freq: Once | INTRAMUSCULAR | Status: AC
  Administered 2015-01-01: 4 mg via INTRAVENOUS
  Filled 2015-01-01: qty 2

## 2015-01-01 MED ORDER — SODIUM CHLORIDE 0.9 % IV BOLUS (SEPSIS)
1000.0000 mL | Freq: Once | INTRAVENOUS | Status: AC
  Administered 2015-01-01: 1000 mL via INTRAVENOUS

## 2015-01-01 NOTE — ED Provider Notes (Signed)
CSN: [id number]     Arrival date & time 01/01/15  1800 History  This chart was scribed for Glynn Octave, MD by Gwenyth Ober, ED Scribe. This patient was seen in room MH07/MH07 and the patient's care was started at 6:59 PM.    Chief Complaint  Patient presents with  . Flank Pain   The history is provided by the patient. No language interpreter was used.    HPI Comments: Michelle Singh is a 34 y.o. female with a history of kidney stones which have required stenting, cholecystectomy and DDD who presents to the Emergency Department complaining of constant, moderate right flank pain that started 2 weeks ago. She states left-sided CP, tingling of her right leg and nausea as associated symptoms. Her chest pain becomes worse with deep breaths. Pt has tried [employer] and Ibuprofen with no relief. Pt has a history of kidney stones and states current symptoms are consistent with prior episodes. She was seen by [facility] regional for the same who did x-rays that were unremarkable. Her LMP was 8/15. Pt's last kidney stone was 2 years ago. She denies a history of similar CP. Pt also denies hematuria, vomiting, rash, vaginal bleeding and vaginal discharge as associated symptoms.  Past Medical History  Diagnosis Date  . Degenerative disc disease   . Reflux   . Attention deficit hyperactivity disorder   . Chronic back pain   . Chronic neck pain   . Asthma   . Chronic pain   . Anemia   . Restless leg syndrome   . Migraine   . Irritable bowel syndrome (IBS)   . Hyperlipemia   . Lung mass    Past Surgical History  Procedure Laterality Date  . Kidney stone surgery    . Cholecystectomy    . Tubal ligation  states has been reversed   No family history on file. Social History  Substance Use Topics  . Smoking status: Never Smoker   . Smokeless tobacco: Never Used  . Alcohol Use: No   OB History    No data available     Review of Systems  Cardiovascular: Positive for chest pain.   Gastrointestinal: Positive for nausea. Negative for vomiting.  Genitourinary: Positive for flank pain. Negative for hematuria, vaginal bleeding and vaginal discharge.  Skin: Negative for rash.  All other systems reviewed and are negative.   Allergies  Tramadol; Milk-related compounds; and Latex  Home Medications   Prior to Admission medications   Medication Sig Start Date End Date Taking? Authorizing Provider  albuterol (PROVENTIL HFA;VENTOLIN HFA) 108 (90 BASE) MCG/ACT inhaler Inhale 2 puffs into the lungs every 4 (four) hours as needed. For cough or wheezing   Yes Historical Provider, MD  DULoxetine (CYMBALTA) 20 MG capsule Take 60 mg by mouth daily.    Yes Historical Provider, MD  Fluticasone-Salmeterol (ADVAIR) 250-50 MCG/DOSE AEPB Inhale 1 puff into the lungs every 12 (twelve) hours.   Yes Historical Provider, MD  omeprazole (PRILOSEC) 40 MG capsule Take 40 mg by mouth at bedtime.   Yes Historical Provider, MD  ciprofloxacin (CIPRO) 500 MG tablet Take 1 tablet (500 mg total) by mouth every 12 (twelve) hours. 04/07/13   Rolland Porter, MD  cyanocobalamin (,VITAMIN B-12,) 1000 MCG/ML injection Inject 1,000 mcg into the muscle every 30 (thirty) days.    Historical Provider, MD  HYDROcodone-acetaminophen (NORCO/VICODIN) 5-325 MG per tablet Take 1 tablet by mouth every 4 (four) hours as needed. 01/01/15   Glynn Octave, MD  ibuprofen (  ADVIL,MOTRIN) 800 MG tablet Take 1 tablet (800 mg total) by mouth 3 (three) times daily. 01/01/15   Glynn Octave, MD  methylphenidate (RITALIN) 10 MG tablet Take 10 mg by mouth 3 (three) times daily as needed. For concentration    Historical Provider, MD  nortriptyline (PAMELOR) 10 MG capsule Take 20 mg by mouth at bedtime.    Historical Provider, MD  ondansetron (ZOFRAN ODT) 4 MG disintegrating tablet Take 1 tablet (4 mg total) by mouth every 8 (eight) hours as needed for nausea. 04/07/13   Rolland Porter, MD  ondansetron (ZOFRAN) 4 MG tablet Take 1 tablet (4 mg  total) by mouth every 6 (six) hours. 01/01/15   Glynn Octave, MD  oxyCODONE-acetaminophen (PERCOCET/ROXICET) 5-325 MG per tablet Take 1-2 tablets by mouth every 4 (four) hours as needed (for pain). 04/11/13   John Molpus, MD  SUMAtriptan (IMITREX) 100 MG tablet Take 100 mg by mouth every 2 (two) hours as needed. For migraine    Historical Provider, MD  tamsulosin (FLOMAX) 0.4 MG CAPS capsule Take 1 capsule (0.4 mg total) by mouth daily. 04/07/13   Rolland Porter, MD  vitamin B-12 (CYANOCOBALAMIN) 1000 MCG tablet Take 2,000 mcg by mouth daily.     Historical Provider, MD   BP 112/82 mmHg  Pulse 100  Temp(Src) 98.1 F (36.7 C) (Oral)  Resp 18  Ht  (1.499 m)  Wt 117 lb (53.071 kg)  BMI 23.62 kg/m2  SpO2 100%  LMP 12/15/2014 (Exact Date) Physical Exam  Constitutional: She is oriented to person, place, and time. She appears well-developed and well-nourished. No distress.  HENT:  Head: Normocephalic and atraumatic.  Mouth/Throat: Oropharynx is clear and moist. No oropharyngeal exudate.  Eyes: Conjunctivae and EOM are normal. Pupils are equal, round, and reactive to light.  Neck: Normal range of motion. Neck supple.  No meningismus.  Cardiovascular: Normal rate, regular rhythm, normal heart sounds and intact distal pulses.   No murmur heard. Pulmonary/Chest: Effort normal and breath sounds normal. No respiratory distress. She exhibits tenderness.  Left chest wall and lateral rib tenderness  Abdominal: Soft. There is tenderness. There is guarding and CVA tenderness (right). There is no rebound.  RLQ tenderness with guarding  Musculoskeletal: Normal range of motion. She exhibits no edema or tenderness.  Neurological: She is alert and oriented to person, place, and time. No cranial nerve deficit. She exhibits normal muscle tone. Coordination normal.  No ataxia on finger to nose bilaterally. No pronator drift. 5/5 strength throughout. CN 2-12 intact. Negative Romberg. Equal grip strength.  Sensation intact. Gait is normal.   Skin: Skin is warm.  Psychiatric: She has a normal mood and affect. Her behavior is normal.  Nursing note and vitals reviewed.   ED Course  Procedures   DIAGNOSTIC STUDIES: Oxygen Saturation is 100% on RA, normal by my interpretation.    COORDINATION OF CARE: 7:07 PM Discussed treatment plan with pt which includes lab work and a chest x-ray. Pt agreed to plan.  9:28 PM Discussed x-ray, CT and lab results with pt. Will send home with pain and nausea medication. Will refer to urologist for follow-up.   Labs Review Labs Reviewed  URINALYSIS, ROUTINE W REFLEX MICROSCOPIC (NOT AT [facility]) - Abnormal; Notable for the following:    pH 8.5 (*)    Leukocytes, UA TRACE (*)    All other components within normal limits  COMPREHENSIVE METABOLIC PANEL - Abnormal; Notable for the following:    Potassium 3.3 (*)    All  other components within normal limits  D-DIMER, QUANTITATIVE (NOT AT [facility]) - Abnormal; Notable for the following:    D-Dimer, Quant 0.78 (*)    All other components within normal limits  PREGNANCY, URINE  CBC WITH DIFFERENTIAL/PLATELET  URINE MICROSCOPIC-ADD ON  TROPONIN I    Imaging Review Dg Chest 2 View  01/01/2015   CLINICAL DATA:  Left-sided chest pain and shortness of breath. History of asthma.  EXAM: CHEST  2 VIEW  COMPARISON:  04/07/2014.  FINDINGS: Midline trachea. Normal heart size and mediastinal contours. No pleural effusion or pneumothorax. Mild overpenetrated upper lobes. Lungs otherwise clear.  IMPRESSION: No acute cardiopulmonary disease.   Electronically Signed   By: Jeronimo Greaves M.D.   On: 01/01/2015 19:55   Ct Angio Chest Pe W/cm &/or Wo Cm  01/01/2015   CLINICAL DATA:  Chest pain  EXAM: CT ANGIOGRAPHY CHEST WITH CONTRAST  TECHNIQUE: Multidetector CT imaging of the chest was performed using the standard protocol during bolus administration of intravenous contrast. Multiplanar CT image reconstructions and MIPs were obtained to  evaluate the vascular anatomy.  CONTRAST:  OMNIPAQUE IOHEXOL 350 MG/ML SOLN  COMPARISON:  Chest radiograph same date, CT abdomen/pelvis same date, dictated separately  FINDINGS: Mediastinum/Nodes: Heart size is normal. No pericardial effusion. Calcified lymph nodes likely indicate previous granulomatous infection. No current lymphadenopathy. Triangular soft tissue density within the anterior mediastinum is most compatible with thymus.  Insert peak technique No focal filling defect is identified up to and including the 3rd order pulmonary arteries to suggest acute pulmonary embolism.  Lungs/Pleura: 3 mm pleural parenchymal left lower lobe opacity image 65 is most likely an incidental finding of no clinical consequence in this young patient. Lungs are otherwise clear. Central airways are patent.  Upper abdomen: Please see dedicated report dictated separately earlier today  Musculoskeletal: No acute osseous abnormality.  Review of the MIP images confirms the above findings.  IMPRESSION: No acute cardiopulmonary process or evidence for acute pulmonary embolism up to and including the third order pulmonary arteries.   Electronically Signed   By: Christiana Pellant M.D.   On: 01/01/2015 22:51   Ct Renal Stone Study  01/01/2015   CLINICAL DATA:  34 year old with 2 week history of right flank pain. Personal history of urinary tract calculi.  EXAM: CT ABDOMEN AND PELVIS WITHOUT CONTRAST  TECHNIQUE: Multidetector CT imaging of the abdomen and pelvis was performed following the standard protocol without IV contrast.  COMPARISON:  04/06/2013.  FINDINGS: Hepatobiliary: Liver normal in size and appearance for the unenhanced technique. Surgically absent. No unexpected biliary ductal dilation.  Spleen: Calcified splenic granulomata as noted previously. Spleen normal in size. No significant focal parenchymal abnormality.  Pancreas:  Normal in appearance.  No pancreatic ductal dilation.  Adrenal glands:  Normal in appearance.   Genitourinary: Very small (1-2 mm) nonobstructing calculi in a mid calix and a lower pole calix of the left kidney, visualized only on the coronal reformatted images. No right urinary tract calculi. No focal parenchymal abnormality involving either kidney, allowing for the unenhanced technique. Normal-appearing urinary bladder.  Normal-appearing uterus and ovaries without evidence of adnexal mass or free pelvic fluid. Endometrial thickening is likely related to the menstrual phase.  Gastrointestinal: Stomach normal in appearance for degree of distention. Normal-appearing small bowel. Normal appearing colon with moderate stool burden. Normal appendix in the right upper pelvis.  Ascites:  Absent.  Vascular:  No visible aortoiliofemoral atherosclerosis.  Lymphatic:  No pathologic lymphadenopathy in the abdomen or pelvis.  Other findings: Very small umbilical hernia containing fat.  Musculoskeletal: Regional skeleton intact.  Visualized lower thorax: Heart size normal.  Lung bases clear.  IMPRESSION: 1. No acute abnormality involving the abdomen or pelvis. 2. Nonobstructing very small (1-2 mm) calculi in a mid calix and a lower pole calix of the left kidney. 3. Calcified splenic granulomata as noted previously.   Electronically Signed   By: Hulan Saas M.D.   On: 01/01/2015 20:22      EKG Interpretation   Date/Time:  Thursday January 01 2015 18:19:18 EDT Ventricular Rate:  98 PR Interval:  146 QRS Duration: 72 QT Interval:  348 QTC Calculation: 444 R Axis:   48 Text Interpretation:  Normal sinus rhythm Normal ECG ED PHYSICIAN  INTERPRETATION AVAILABLE IN [facility] Confirmed by TEST, Record  ([id number]) on 01/02/2015 7:20:41 AM      MDM   Final diagnoses:  Flank pain  Atypical chest pain   R flank pain since 8/21.  Hx kidney stones.  Also with L sided reproducible chest pain that developed after arrival to ED.  EKG unchanged, nonspecific ST changes. L chest wall tender and pain  reproducible.  Atypical for ACS. UA negative.  CXR negative.  CT with tiny renal stones only.  Normal appendix. No ureteral stones. D/w patient she may have recently passed stone.  Narcotic database reviewed.  No prescriptions since March.  Short course of pain meds and antiinflammatories with be provided.  Chest pain atypical for ACS, PE, PTX, aortic dissection.  I personally performed the services described in this documentation, which was scribed in my presence. The recorded information has been reviewed and is accurate.    Glynn Octave, MD 01/02/15 [phone]

## 2015-01-01 NOTE — Discharge Instructions (Signed)
Flank Pain There is no evidence of heart attack in the lung. Take medications prescribed. Follow-up with her doctor. Return to the ED if you develop new or worsening symptoms. Flank pain refers to pain that is located on the side of the body between the upper abdomen and the back. The pain may occur over a short period of time (acute) or may be long-term or reoccurring (chronic). It may be mild or severe. Flank pain can be caused by many things. CAUSES  Some of the more common causes of flank pain include:  Muscle strains.   Muscle spasms.   A disease of your spine (vertebral disk disease).   A lung infection (pneumonia).   Fluid around your lungs (pulmonary edema).   A kidney infection.   Kidney stones.   A very painful skin rash caused by the chickenpox virus (shingles).   Gallbladder disease.  HOME CARE INSTRUCTIONS  Home care will depend on the cause of your pain. In general,  Rest as directed by your caregiver.  Drink enough fluids to keep your urine clear or pale yellow.  Only take over-the-counter or prescription medicines as directed by your caregiver. Some medicines may help relieve the pain.  Tell your caregiver about any changes in your pain.  Follow up with your caregiver as directed. SEEK IMMEDIATE MEDICAL CARE IF:   Your pain is not controlled with medicine.   You have new or worsening symptoms.  Your pain increases.   You have abdominal pain.   You have shortness of breath.   You have persistent nausea or vomiting.   You have swelling in your abdomen.   You feel faint or pass out.   You have blood in your urine.  You have a fever or persistent symptoms for more than 2-3 days.  You have a fever and your symptoms suddenly get worse. MAKE SURE YOU:   Understand these instructions.  Will watch your condition.  Will get help right away if you are not doing well or get worse. Document Released: 06/09/2005 Document Revised:  01/11/2012 Document Reviewed: 12/01/2011 [facility] Patient Information 2015 [place], [place]. This information is not intended to replace advice given to you by your health care provider. Make sure you discuss any questions you have with your health care provider.

## 2015-01-01 NOTE — ED Notes (Addendum)
Pt reports Right flank pain since 8/21. Seen at [facility] but no imaging done. States sx are getting worse. C/o dysuria

## 2015-01-01 NOTE — ED Notes (Signed)
Patient transported to CT 

## 2015-01-01 NOTE — ED Notes (Signed)
Pt now c/o of chest pain, onset while sitting in triage chair

## 2015-01-19 ENCOUNTER — Ambulatory Visit (HOSPITAL_COMMUNITY): Admission: RE | Admit: 2015-01-19 | Payer: Self-pay | Source: Ambulatory Visit

## 2015-03-27 ENCOUNTER — Emergency Department (HOSPITAL_BASED_OUTPATIENT_CLINIC_OR_DEPARTMENT_OTHER)
Admission: EM | Admit: 2015-03-27 | Discharge: 2015-03-27 | Disposition: A | Discharge status: Home / Self Care | Payer: Medicaid Other | Attending: Emergency Medicine | Admitting: Emergency Medicine

## 2015-03-27 ENCOUNTER — Encounter (HOSPITAL_BASED_OUTPATIENT_CLINIC_OR_DEPARTMENT_OTHER): Payer: Self-pay | Admitting: Emergency Medicine

## 2015-03-27 DIAGNOSIS — K219 Gastro-esophageal reflux disease without esophagitis: Secondary | ICD-10-CM | POA: Insufficient documentation

## 2015-03-27 DIAGNOSIS — K589 Irritable bowel syndrome without diarrhea: Secondary | ICD-10-CM | POA: Insufficient documentation

## 2015-03-27 DIAGNOSIS — J45909 Unspecified asthma, uncomplicated: Secondary | ICD-10-CM | POA: Insufficient documentation

## 2015-03-27 DIAGNOSIS — G43809 Other migraine, not intractable, without status migrainosus: Secondary | ICD-10-CM | POA: Diagnosis not present

## 2015-03-27 DIAGNOSIS — Z862 Personal history of diseases of the blood and blood-forming organs and certain disorders involving the immune mechanism: Secondary | ICD-10-CM | POA: Insufficient documentation

## 2015-03-27 DIAGNOSIS — Z3202 Encounter for pregnancy test, result negative: Secondary | ICD-10-CM | POA: Diagnosis not present

## 2015-03-27 DIAGNOSIS — R51 Headache: Secondary | ICD-10-CM | POA: Diagnosis present

## 2015-03-27 DIAGNOSIS — G8929 Other chronic pain: Secondary | ICD-10-CM | POA: Insufficient documentation

## 2015-03-27 DIAGNOSIS — F909 Attention-deficit hyperactivity disorder, unspecified type: Secondary | ICD-10-CM | POA: Diagnosis not present

## 2015-03-27 DIAGNOSIS — Z7951 Long term (current) use of inhaled steroids: Secondary | ICD-10-CM | POA: Insufficient documentation

## 2015-03-27 DIAGNOSIS — Z91040 Latex allergy status: Secondary | ICD-10-CM | POA: Insufficient documentation

## 2015-03-27 DIAGNOSIS — Z8639 Personal history of other endocrine, nutritional and metabolic disease: Secondary | ICD-10-CM | POA: Diagnosis not present

## 2015-03-27 DIAGNOSIS — Z79899 Other long term (current) drug therapy: Secondary | ICD-10-CM | POA: Insufficient documentation

## 2015-03-27 LAB — PREGNANCY, URINE: PREG TEST UR: NEGATIVE

## 2015-03-27 MED ORDER — METOCLOPRAMIDE HCL 5 MG/ML IJ SOLN
10.0000 mg | Freq: Once | INTRAMUSCULAR | Status: AC
  Administered 2015-03-27: 10 mg via INTRAVENOUS
  Filled 2015-03-27: qty 2

## 2015-03-27 MED ORDER — KETOROLAC TROMETHAMINE 30 MG/ML IJ SOLN
30.0000 mg | Freq: Once | INTRAMUSCULAR | Status: AC
  Administered 2015-03-27: 30 mg via INTRAVENOUS
  Filled 2015-03-27: qty 1

## 2015-03-27 MED ORDER — DEXAMETHASONE SODIUM PHOSPHATE 10 MG/ML IJ SOLN
10.0000 mg | Freq: Once | INTRAMUSCULAR | Status: AC
  Administered 2015-03-27: 10 mg via INTRAVENOUS
  Filled 2015-03-27: qty 1

## 2015-03-27 MED ORDER — DIPHENHYDRAMINE HCL 50 MG/ML IJ SOLN
25.0000 mg | Freq: Once | INTRAMUSCULAR | Status: AC
  Administered 2015-03-27: 25 mg via INTRAVENOUS
  Filled 2015-03-27: qty 1

## 2015-03-27 NOTE — Discharge Instructions (Signed)

## 2015-03-27 NOTE — ED Notes (Signed)
Migraine with vomiting since yesterday. This feels like her usual migraine but her usual meds are not working.

## 2015-03-27 NOTE — ED Notes (Signed)
Patient is alert and oriented x3.  She was given DC instructions and follow up visit instructions.  Patient gave verbal understanding. She was DC ambulatory under her own power to home.  V/S stable.  He was not showing any signs of distress on DC 

## 2015-03-27 NOTE — ED Provider Notes (Signed)
CSN: [id number]     Arrival date & time 03/27/15  2134 History  By signing my name below, I, Budd PalmerVanessa Prueter, attest that this documentation has been prepared under the direction and in the presence of Mirian MoMatthew Gentry, MD. Electronically Signed: Budd PalmerVanessa Prueter, ED Scribe. 03/27/2015. 10:39 PM.    Chief Complaint  Patient presents with  . Migraine   Patient is a 34 y.o. female presenting with migraines. The history is provided by the patient. No language interpreter was used.  Migraine This is a recurrent problem. The current episode started yesterday. The problem occurs constantly. The problem has been gradually worsening. Associated symptoms include headaches. Nothing relieves the symptoms.   HPI Comments: Netta CorriganMisty Beldin is a 34 y.o. female with a PMHx of migraine who presents to the Emergency Department complaining of a constant migraine of gradual onset 1 day ago. Identical to prior. She reports associated n/v, HA, and photophobia. She states she has tried Imitrex, ibuprofen, and robaxin with no relief. She notes her most recent migraine occurred 1 week ago and was relieved after her PCP gave her a shot of Toradol. Pt denies diarrhea and fever.   Past Medical History  Diagnosis Date  . Degenerative disc disease   . Reflux   . Attention deficit hyperactivity disorder   . Chronic back pain   . Chronic neck pain   . Asthma   . Chronic pain   . Anemia   . Restless leg syndrome   . Migraine   . Irritable bowel syndrome (IBS)   . Hyperlipemia   . Lung mass    Past Surgical History  Procedure Laterality Date  . Kidney stone surgery    . Cholecystectomy    . Tubal ligation  states has been reversed  . Tubal ligation reversal     No family history on file. Social History  Substance Use Topics  . Smoking status: Never Smoker   . Smokeless tobacco: Never Used  . Alcohol Use: No   OB History    No data available     Review of Systems  Constitutional: Negative for fever.  Eyes:  Positive for photophobia.  Gastrointestinal: Positive for nausea and vomiting. Negative for diarrhea.  Neurological: Positive for headaches.  All other systems reviewed and are negative.   Allergies  Tramadol; Milk-related compounds; and Latex  Home Medications   Prior to Admission medications   Medication Sig Start Date End Date Taking? Authorizing Provider  budesonide-formoterol (SYMBICORT) 160-4.5 MCG/ACT inhaler Inhale 2 puffs into the lungs 2 (two) times daily.   Yes Historical Provider, MD  methocarbamol (ROBAXIN) 750 MG tablet Take 750 mg by mouth 4 (four) times daily.   Yes Historical Provider, MD  albuterol (PROVENTIL HFA;VENTOLIN HFA) 108 (90 BASE) MCG/ACT inhaler Inhale 2 puffs into the lungs every 4 (four) hours as needed. For cough or wheezing    Historical Provider, MD  ciprofloxacin (CIPRO) 500 MG tablet Take 1 tablet (500 mg total) by mouth every 12 (twelve) hours. 04/07/13   Rolland PorterMark James, MD  cyanocobalamin (,VITAMIN B-12,) 1000 MCG/ML injection Inject 1,000 mcg into the muscle every 30 (thirty) days.    Historical Provider, MD  DULoxetine (CYMBALTA) 20 MG capsule Take 60 mg by mouth daily.     Historical Provider, MD  Fluticasone-Salmeterol (ADVAIR) 250-50 MCG/DOSE AEPB Inhale 1 puff into the lungs every 12 (twelve) hours.    Historical Provider, MD  HYDROcodone-acetaminophen (NORCO/VICODIN) 5-325 MG per tablet Take 1 tablet by mouth every 4 (four) hours  as needed. 01/01/15   Glynn Octave, MD  ibuprofen (ADVIL,MOTRIN) 800 MG tablet Take 1 tablet (800 mg total) by mouth 3 (three) times daily. 01/01/15   Glynn Octave, MD  methylphenidate (RITALIN) 10 MG tablet Take 10 mg by mouth 3 (three) times daily as needed. For concentration    Historical Provider, MD  nortriptyline (PAMELOR) 10 MG capsule Take 20 mg by mouth at bedtime.    Historical Provider, MD  omeprazole (PRILOSEC) 40 MG capsule Take 40 mg by mouth at bedtime.    Historical Provider, MD  ondansetron (ZOFRAN ODT) 4 MG  disintegrating tablet Take 1 tablet (4 mg total) by mouth every 8 (eight) hours as needed for nausea. 04/07/13   Rolland Porter, MD  ondansetron (ZOFRAN) 4 MG tablet Take 1 tablet (4 mg total) by mouth every 6 (six) hours. 01/01/15   Glynn Octave, MD  oxyCODONE-acetaminophen (PERCOCET/ROXICET) 5-325 MG per tablet Take 1-2 tablets by mouth every 4 (four) hours as needed (for pain). 04/11/13   John Molpus, MD  SUMAtriptan (IMITREX) 100 MG tablet Take 100 mg by mouth every 2 (two) hours as needed. For migraine    Historical Provider, MD  tamsulosin (FLOMAX) 0.4 MG CAPS capsule Take 1 capsule (0.4 mg total) by mouth daily. 04/07/13   Rolland Porter, MD  vitamin B-12 (CYANOCOBALAMIN) 1000 MCG tablet Take 2,000 mcg by mouth daily.     Historical Provider, MD   BP 102/63 mmHg  Pulse 88  Temp(Src) 98.2 F (36.8 C) (Oral)  Resp 18  Ht  (1.473 m)  Wt 116 lb (52.617 kg)  BMI 24.25 kg/m2  SpO2 99%  LMP 03/13/2015 (Exact Date) Physical Exam  Constitutional: She is oriented to person, place, and time. She appears well-developed and well-nourished.  HENT:  Head: Normocephalic and atraumatic.  Right Ear: External ear normal.  Left Ear: External ear normal.  Eyes: Conjunctivae and EOM are normal. Pupils are equal, round, and reactive to light.  Neck: Normal range of motion. Neck supple.  Cardiovascular: Normal rate, regular rhythm, normal heart sounds and intact distal pulses.   Pulmonary/Chest: Effort normal and breath sounds normal.  Abdominal: Soft. Bowel sounds are normal. There is no tenderness.  Musculoskeletal: Normal range of motion.  Neurological: She is alert and oriented to person, place, and time. She has normal strength and normal reflexes. No cranial nerve deficit or sensory deficit. Coordination normal. GCS eye subscore is 4. GCS verbal subscore is 5. GCS motor subscore is 6.  Skin: Skin is warm and dry.  Vitals reviewed.   ED Course  Procedures  DIAGNOSTIC STUDIES: Oxygen Saturation  is 99% on RA, normal by my interpretation.    COORDINATION OF CARE: 10:30 PM - Discussed plans to order a migraine cocktail. Pt advised of plan for treatment and pt agrees.  Labs Review Labs Reviewed  PREGNANCY, URINE    Imaging Review No results found. I have personally reviewed and evaluated these images and lab results as part of my medical decision-making.   EKG Interpretation None      MDM   Final diagnoses:  Other migraine without status migrainosus, not intractable    34 y.o. female with pertinent PMH of chronic ha presents with HA identical to prior.  No new symptoms.  No concerning historical elements.  Exam benign.  Relieved with reglan, benadryl, toradol, decadron.  DC home in stable condition.    I have reviewed all laboratory and imaging studies if ordered as above  1. Other migraine without  status migrainosus, not intractable           Mirian Mo, MD 03/28/15 0002

## 2015-04-18 ENCOUNTER — Encounter (HOSPITAL_BASED_OUTPATIENT_CLINIC_OR_DEPARTMENT_OTHER): Payer: Self-pay | Admitting: Emergency Medicine

## 2015-04-18 ENCOUNTER — Emergency Department (HOSPITAL_BASED_OUTPATIENT_CLINIC_OR_DEPARTMENT_OTHER)
Admission: EM | Admit: 2015-04-18 | Discharge: 2015-04-18 | Disposition: A | Discharge status: Home / Self Care | Payer: Medicaid Other | Attending: Emergency Medicine | Admitting: Emergency Medicine

## 2015-04-18 DIAGNOSIS — G43001 Migraine without aura, not intractable, with status migrainosus: Secondary | ICD-10-CM | POA: Insufficient documentation

## 2015-04-18 DIAGNOSIS — Z91040 Latex allergy status: Secondary | ICD-10-CM | POA: Insufficient documentation

## 2015-04-18 DIAGNOSIS — Z8639 Personal history of other endocrine, nutritional and metabolic disease: Secondary | ICD-10-CM | POA: Insufficient documentation

## 2015-04-18 DIAGNOSIS — K219 Gastro-esophageal reflux disease without esophagitis: Secondary | ICD-10-CM | POA: Diagnosis not present

## 2015-04-18 DIAGNOSIS — Z7951 Long term (current) use of inhaled steroids: Secondary | ICD-10-CM | POA: Insufficient documentation

## 2015-04-18 DIAGNOSIS — J45909 Unspecified asthma, uncomplicated: Secondary | ICD-10-CM | POA: Diagnosis not present

## 2015-04-18 DIAGNOSIS — K589 Irritable bowel syndrome without diarrhea: Secondary | ICD-10-CM | POA: Diagnosis not present

## 2015-04-18 DIAGNOSIS — Z862 Personal history of diseases of the blood and blood-forming organs and certain disorders involving the immune mechanism: Secondary | ICD-10-CM | POA: Insufficient documentation

## 2015-04-18 DIAGNOSIS — Z79899 Other long term (current) drug therapy: Secondary | ICD-10-CM | POA: Insufficient documentation

## 2015-04-18 DIAGNOSIS — G8929 Other chronic pain: Secondary | ICD-10-CM | POA: Diagnosis not present

## 2015-04-18 DIAGNOSIS — G2581 Restless legs syndrome: Secondary | ICD-10-CM | POA: Insufficient documentation

## 2015-04-18 DIAGNOSIS — Z791 Long term (current) use of non-steroidal anti-inflammatories (NSAID): Secondary | ICD-10-CM | POA: Insufficient documentation

## 2015-04-18 DIAGNOSIS — F909 Attention-deficit hyperactivity disorder, unspecified type: Secondary | ICD-10-CM | POA: Diagnosis not present

## 2015-04-18 DIAGNOSIS — R51 Headache: Secondary | ICD-10-CM | POA: Diagnosis present

## 2015-04-18 MED ORDER — SODIUM CHLORIDE 0.9 % IV BOLUS (SEPSIS)
1000.0000 mL | Freq: Once | INTRAVENOUS | Status: AC
  Administered 2015-04-18: 1000 mL via INTRAVENOUS

## 2015-04-18 MED ORDER — METOCLOPRAMIDE HCL 5 MG/ML IJ SOLN
10.0000 mg | Freq: Once | INTRAMUSCULAR | Status: AC
  Administered 2015-04-18: 10 mg via INTRAVENOUS
  Filled 2015-04-18: qty 2

## 2015-04-18 MED ORDER — DEXAMETHASONE SODIUM PHOSPHATE 10 MG/ML IJ SOLN
10.0000 mg | Freq: Once | INTRAMUSCULAR | Status: AC
  Administered 2015-04-18: 10 mg via INTRAVENOUS
  Filled 2015-04-18: qty 1

## 2015-04-18 MED ORDER — DIPHENHYDRAMINE HCL 50 MG/ML IJ SOLN
25.0000 mg | Freq: Once | INTRAMUSCULAR | Status: AC
  Administered 2015-04-18: 25 mg via INTRAVENOUS
  Filled 2015-04-18: qty 1

## 2015-04-18 NOTE — Discharge Instructions (Signed)

## 2015-04-18 NOTE — ED Notes (Signed)
Patient reports that she has had a MHA x 8 days. She has been to MD x 2 for interventions. Patient reports that she is having N denies any Vomiting. The patient is laughing and talking without any distress in triage. Wearing sunglasses

## 2015-04-18 NOTE — ED Provider Notes (Signed)
CSN: [id number]     Arrival date & time 04/18/15  1813 History  By signing my name below, I, Terrance Branch, attest that this documentation has been prepared under the direction and in the presence of Rolan BuccoMelanie Sophonie Goforth, MD. Electronically Signed: Evon Slackerrance Branch, ED Scribe. 04/18/2015. 9:19 PM.     Chief Complaint  Patient presents with  . Headache   The history is provided by the patient. No language interpreter was used.   HPI Comments: Michelle Singh is a 34 y.o. female who presents to the Emergency Department complaining of migraine HA onset 8 day prior. Pt states that she has associated photophobia, sound sensitivity and nausea. Pt states that she has tried Toradol, Imitrex ibuprofen robaxin with no relief. She got a shot of toradol at her PCP office without relief.  Pt states that she has recently started Viibryd and was told that she is not suppose to mix it with her Imitrex. Pt denies vomiting fever or other related symptoms.  States her H/a feels the same as her prior headaches.  No unusual symptoms.    Past Medical History  Diagnosis Date  . Degenerative disc disease   . Reflux   . Attention deficit hyperactivity disorder   . Chronic back pain   . Chronic neck pain   . Asthma   . Chronic pain   . Anemia   . Restless leg syndrome   . Migraine   . Irritable bowel syndrome (IBS)   . Hyperlipemia   . Lung mass    Past Surgical History  Procedure Laterality Date  . Kidney stone surgery    . Cholecystectomy    . Tubal ligation  states has been reversed  . Tubal ligation reversal     History reviewed. No pertinent family history. Social History  Substance Use Topics  . Smoking status: Never Smoker   . Smokeless tobacco: Never Used  . Alcohol Use: No   OB History    No data available     Review of Systems  Constitutional: Negative for fever, chills, diaphoresis and fatigue.  HENT: Negative for congestion, rhinorrhea and sneezing.   Eyes: Positive for photophobia.   Respiratory: Negative for cough, chest tightness and shortness of breath.   Cardiovascular: Negative for chest pain and leg swelling.  Gastrointestinal: Positive for nausea. Negative for vomiting, abdominal pain, diarrhea and blood in stool.  Genitourinary: Negative for frequency, hematuria, flank pain and difficulty urinating.  Musculoskeletal: Negative for back pain and arthralgias.  Skin: Negative for rash.  Neurological: Positive for headaches. Negative for dizziness, speech difficulty, weakness and numbness.      Allergies  Tramadol; Milk-related compounds; and Latex  Home Medications   Prior to Admission medications   Medication Sig Start Date End Date Taking? Authorizing Provider  albuterol (PROVENTIL HFA;VENTOLIN HFA) 108 (90 BASE) MCG/ACT inhaler Inhale 2 puffs into the lungs every 4 (four) hours as needed. For cough or wheezing    Historical Provider, MD  budesonide-formoterol (SYMBICORT) 160-4.5 MCG/ACT inhaler Inhale 2 puffs into the lungs 2 (two) times daily.    Historical Provider, MD  ciprofloxacin (CIPRO) 500 MG tablet Take 1 tablet (500 mg total) by mouth every 12 (twelve) hours. 04/07/13   Rolland PorterMark James, MD  cyanocobalamin (,VITAMIN B-12,) 1000 MCG/ML injection Inject 1,000 mcg into the muscle every 30 (thirty) days.    Historical Provider, MD  DULoxetine (CYMBALTA) 20 MG capsule Take 60 mg by mouth daily.     Historical Provider, MD  Fluticasone-Salmeterol (ADVAIR) 250-50  MCG/DOSE AEPB Inhale 1 puff into the lungs every 12 (twelve) hours.    Historical Provider, MD  HYDROcodone-acetaminophen (NORCO/VICODIN) 5-325 MG per tablet Take 1 tablet by mouth every 4 (four) hours as needed. 01/01/15   Glynn Octave, MD  ibuprofen (ADVIL,MOTRIN) 800 MG tablet Take 1 tablet (800 mg total) by mouth 3 (three) times daily. 01/01/15   Glynn Octave, MD  methocarbamol (ROBAXIN) 750 MG tablet Take 750 mg by mouth 4 (four) times daily.    Historical Provider, MD  methylphenidate (RITALIN) 10  MG tablet Take 10 mg by mouth 3 (three) times daily as needed. For concentration    Historical Provider, MD  nortriptyline (PAMELOR) 10 MG capsule Take 20 mg by mouth at bedtime.    Historical Provider, MD  omeprazole (PRILOSEC) 40 MG capsule Take 40 mg by mouth at bedtime.    Historical Provider, MD  ondansetron (ZOFRAN ODT) 4 MG disintegrating tablet Take 1 tablet (4 mg total) by mouth every 8 (eight) hours as needed for nausea. 04/07/13   Rolland Porter, MD  ondansetron (ZOFRAN) 4 MG tablet Take 1 tablet (4 mg total) by mouth every 6 (six) hours. 01/01/15   Glynn Octave, MD  oxyCODONE-acetaminophen (PERCOCET/ROXICET) 5-325 MG per tablet Take 1-2 tablets by mouth every 4 (four) hours as needed (for pain). 04/11/13   John Molpus, MD  SUMAtriptan (IMITREX) 100 MG tablet Take 100 mg by mouth every 2 (two) hours as needed. For migraine    Historical Provider, MD  tamsulosin (FLOMAX) 0.4 MG CAPS capsule Take 1 capsule (0.4 mg total) by mouth daily. 04/07/13   Rolland Porter, MD  vitamin B-12 (CYANOCOBALAMIN) 1000 MCG tablet Take 2,000 mcg by mouth daily.     Historical Provider, MD   BP 128/81 mmHg  Pulse 76  Temp(Src) 98.2 F (36.8 C) (Oral)  Resp 16  Ht  (1.473 m)  Wt 115 lb (52.164 kg)  BMI 24.04 kg/m2  SpO2 100%  LMP 03/13/2015 (Exact Date)   Physical Exam  Constitutional: She is oriented to person, place, and time. She appears well-developed and well-nourished.  HENT:  Head: Normocephalic and atraumatic.  Eyes: Pupils are equal, round, and reactive to light.  Neck: Normal range of motion. Neck supple.  No meningismus  Cardiovascular: Normal rate, regular rhythm and normal heart sounds.   Pulmonary/Chest: Effort normal and breath sounds normal. No respiratory distress. She has no wheezes. She has no rales. She exhibits no tenderness.  Abdominal: Soft. Bowel sounds are normal. There is no tenderness. There is no rebound and no guarding.  Musculoskeletal: Normal range of motion. She  exhibits no edema.  Lymphadenopathy:    She has no cervical adenopathy.  Neurological: She is alert and oriented to person, place, and time. She has normal strength. No cranial nerve deficit or sensory deficit. GCS eye subscore is 4. GCS verbal subscore is 5. GCS motor subscore is 6.  No motor or sensory deficits, failure nose intact, no pronator drift  Skin: Skin is warm and dry. No rash noted.  Psychiatric: She has a normal mood and affect.  Nursing note and vitals reviewed.   ED Course  Procedures (including critical care time) DIAGNOSTIC STUDIES: Oxygen Saturation is 100% on RA, normal by my interpretation.    COORDINATION OF CARE: 8:08 PM-Discussed treatment plan with pt at bedside and pt agreed to plan.     Labs Review Labs Reviewed - No data to display  Imaging Review No results found.    EKG Interpretation  None      MDM   Final diagnoses:  Migraine without aura and with status migrainosus, not intractable   Patient presents with a headache. Her headache is similar to her past migraines type headaches. She had no sudden onset of headache, neck pain or stiffness or other symptoms that we more suggestive of subarachnoid hemorrhage or meningitis. She feels markedly better after migraine cocktail. She feels like she is ready to go home. She was encouraged to follow-up with her PCP. She was encouraged to return here if she has any worsening symptoms or unusual type headaches. I did advise her to check with her PCP as to whether to continue her new antidepressant with the Imitrex that she's currently taking. There is a reported increase of serotonin syndrome with this combination.   I personally performed the services described in this documentation, which was scribed in my presence.  The recorded information has been reviewed and considered.      Rolan Bucco, MD 04/18/15 2121

## 2015-06-26 ENCOUNTER — Emergency Department (HOSPITAL_BASED_OUTPATIENT_CLINIC_OR_DEPARTMENT_OTHER)
Admission: EM | Admit: 2015-06-26 | Discharge: 2015-06-27 | Disposition: A | Discharge status: Home / Self Care | Payer: Medicaid Other | Attending: Emergency Medicine | Admitting: Emergency Medicine

## 2015-06-26 ENCOUNTER — Encounter (HOSPITAL_BASED_OUTPATIENT_CLINIC_OR_DEPARTMENT_OTHER): Payer: Self-pay | Admitting: *Deleted

## 2015-06-26 DIAGNOSIS — Z79899 Other long term (current) drug therapy: Secondary | ICD-10-CM | POA: Insufficient documentation

## 2015-06-26 DIAGNOSIS — F909 Attention-deficit hyperactivity disorder, unspecified type: Secondary | ICD-10-CM | POA: Insufficient documentation

## 2015-06-26 DIAGNOSIS — K219 Gastro-esophageal reflux disease without esophagitis: Secondary | ICD-10-CM | POA: Insufficient documentation

## 2015-06-26 DIAGNOSIS — K589 Irritable bowel syndrome without diarrhea: Secondary | ICD-10-CM | POA: Diagnosis not present

## 2015-06-26 DIAGNOSIS — Z3202 Encounter for pregnancy test, result negative: Secondary | ICD-10-CM | POA: Insufficient documentation

## 2015-06-26 DIAGNOSIS — Z862 Personal history of diseases of the blood and blood-forming organs and certain disorders involving the immune mechanism: Secondary | ICD-10-CM | POA: Diagnosis not present

## 2015-06-26 DIAGNOSIS — Z91040 Latex allergy status: Secondary | ICD-10-CM | POA: Insufficient documentation

## 2015-06-26 DIAGNOSIS — G8929 Other chronic pain: Secondary | ICD-10-CM | POA: Diagnosis not present

## 2015-06-26 DIAGNOSIS — G2581 Restless legs syndrome: Secondary | ICD-10-CM | POA: Insufficient documentation

## 2015-06-26 DIAGNOSIS — Z791 Long term (current) use of non-steroidal anti-inflammatories (NSAID): Secondary | ICD-10-CM | POA: Insufficient documentation

## 2015-06-26 DIAGNOSIS — Z8639 Personal history of other endocrine, nutritional and metabolic disease: Secondary | ICD-10-CM | POA: Diagnosis not present

## 2015-06-26 DIAGNOSIS — J45909 Unspecified asthma, uncomplicated: Secondary | ICD-10-CM | POA: Insufficient documentation

## 2015-06-26 DIAGNOSIS — Z7951 Long term (current) use of inhaled steroids: Secondary | ICD-10-CM | POA: Insufficient documentation

## 2015-06-26 DIAGNOSIS — M549 Dorsalgia, unspecified: Secondary | ICD-10-CM

## 2015-06-26 DIAGNOSIS — Z9889 Other specified postprocedural states: Secondary | ICD-10-CM | POA: Insufficient documentation

## 2015-06-26 DIAGNOSIS — M545 Low back pain: Secondary | ICD-10-CM | POA: Diagnosis not present

## 2015-06-26 LAB — URINALYSIS, ROUTINE W REFLEX MICROSCOPIC
Bilirubin Urine: NEGATIVE
GLUCOSE, UA: NEGATIVE mg/dL
Ketones, ur: NEGATIVE mg/dL
Leukocytes, UA: NEGATIVE
Nitrite: NEGATIVE
Protein, ur: NEGATIVE mg/dL
SPECIFIC GRAVITY, URINE: 1.016 (ref 1.005–1.030)
pH: 7 (ref 5.0–8.0)

## 2015-06-26 LAB — URINE MICROSCOPIC-ADD ON

## 2015-06-26 LAB — PREGNANCY, URINE: PREG TEST UR: NEGATIVE

## 2015-06-26 MED ORDER — KETOROLAC TROMETHAMINE 30 MG/ML IJ SOLN
30.0000 mg | Freq: Once | INTRAMUSCULAR | Status: AC
  Administered 2015-06-26: 30 mg via INTRAMUSCULAR
  Filled 2015-06-26: qty 1

## 2015-06-26 MED ORDER — DEXAMETHASONE SODIUM PHOSPHATE 10 MG/ML IJ SOLN
10.0000 mg | Freq: Once | INTRAMUSCULAR | Status: AC
  Administered 2015-06-26: 10 mg via INTRAMUSCULAR
  Filled 2015-06-26: qty 1

## 2015-06-26 NOTE — ED Notes (Signed)
Pt. Reports she has chronic low back pain since 2005.  Pt. Also c/o L leg pain.  Pt. Reports she picked up a heavy baby causing her to have pain.

## 2015-06-26 NOTE — ED Provider Notes (Signed)
CSN: [id number]     Arrival date & time 06/26/15  2117 History   First MD Initiated Contact with Patient 06/26/15 2304     Chief Complaint  Patient presents with  . Back Pain     (Consider location/radiation/quality/duration/timing/severity/associated sxs/prior Treatment) HPI Michelle Singh is a 35 y.o. female history of chronic back pain, comes in for evaluation of acute back pain. Patient reports on Wednesday, when bending over to pick up her niece she experienced sudden onset lower left back pain that radiates into her left leg. She has taken Robaxin without relief of her symptoms. She does report intermittent tingling in her left leg, denies loss of bowel or bladder function, fevers, history of IV drug use or cancer. Pain is moderate in ED. No other modifying factors.  Past Medical History  Diagnosis Date  . Degenerative disc disease   . Reflux   . Attention deficit hyperactivity disorder   . Chronic back pain   . Chronic neck pain   . Asthma   . Chronic pain   . Anemia   . Restless leg syndrome   . Migraine   . Irritable bowel syndrome (IBS)   . Hyperlipemia   . Lung mass    Past Surgical History  Procedure Laterality Date  . Kidney stone surgery    . Cholecystectomy    . Tubal ligation  states has been reversed  . Tubal ligation reversal     No family history on file. Social History  Substance Use Topics  . Smoking status: Never Smoker   . Smokeless tobacco: Never Used  . Alcohol Use: No   OB History    No data available     Review of Systems A 10 point review of systems was completed and was negative except for pertinent positives and negatives as mentioned in the history of present illness     Allergies  Tramadol; Milk-related compounds; and Latex  Home Medications   Prior to Admission medications   Medication Sig Start Date End Date Taking? Authorizing Provider  desvenlafaxine (PRISTIQ) 50 MG 24 hr tablet Take 50 mg by mouth daily.   Yes Historical  Provider, MD  albuterol (PROVENTIL HFA;VENTOLIN HFA) 108 (90 BASE) MCG/ACT inhaler Inhale 2 puffs into the lungs every 4 (four) hours as needed. For cough or wheezing    Historical Provider, MD  budesonide-formoterol (SYMBICORT) 160-4.5 MCG/ACT inhaler Inhale 2 puffs into the lungs 2 (two) times daily.    Historical Provider, MD  ciprofloxacin (CIPRO) 500 MG tablet Take 1 tablet (500 mg total) by mouth every 12 (twelve) hours. 04/07/13   Rolland Porter, MD  cyanocobalamin (,VITAMIN B-12,) 1000 MCG/ML injection Inject 1,000 mcg into the muscle every 30 (thirty) days.    Historical Provider, MD  DULoxetine (CYMBALTA) 20 MG capsule Take 60 mg by mouth daily.     Historical Provider, MD  Fluticasone-Salmeterol (ADVAIR) 250-50 MCG/DOSE AEPB Inhale 1 puff into the lungs every 12 (twelve) hours.    Historical Provider, MD  HYDROcodone-acetaminophen (NORCO/VICODIN) 5-325 MG per tablet Take 1 tablet by mouth every 4 (four) hours as needed. 01/01/15   Glynn Octave, MD  ibuprofen (ADVIL,MOTRIN) 800 MG tablet Take 1 tablet (800 mg total) by mouth 3 (three) times daily. 01/01/15   Glynn Octave, MD  ketorolac (TORADOL) 10 MG tablet Take 1 tablet (10 mg total) by mouth every 6 (six) hours as needed. 06/27/15   Joycie Peek, PA-C  methocarbamol (ROBAXIN) 750 MG tablet Take 750 mg by mouth 4 (  four) times daily.    Historical Provider, MD  methylphenidate (RITALIN) 10 MG tablet Take 10 mg by mouth 3 (three) times daily as needed. For concentration    Historical Provider, MD  nortriptyline (PAMELOR) 10 MG capsule Take 20 mg by mouth at bedtime.    Historical Provider, MD  omeprazole (PRILOSEC) 40 MG capsule Take 40 mg by mouth at bedtime.    Historical Provider, MD  ondansetron (ZOFRAN ODT) 4 MG disintegrating tablet Take 1 tablet (4 mg total) by mouth every 8 (eight) hours as needed for nausea. 04/07/13   Rolland Porter, MD  ondansetron (ZOFRAN) 4 MG tablet Take 1 tablet (4 mg total) by mouth every 6 (six) hours. 01/01/15    Glynn Octave, MD  oxyCODONE-acetaminophen (PERCOCET/ROXICET) 5-325 MG per tablet Take 1-2 tablets by mouth every 4 (four) hours as needed (for pain). 04/11/13   John Molpus, MD  predniSONE (DELTASONE) 20 MG tablet Take 2 tablets (40 mg total) by mouth daily. 06/27/15   Joycie Peek, PA-C  SUMAtriptan (IMITREX) 100 MG tablet Take 100 mg by mouth every 2 (two) hours as needed. For migraine    Historical Provider, MD  tamsulosin (FLOMAX) 0.4 MG CAPS capsule Take 1 capsule (0.4 mg total) by mouth daily. 04/07/13   Rolland Porter, MD  vitamin B-12 (CYANOCOBALAMIN) 1000 MCG tablet Take 2,000 mcg by mouth daily.     Historical Provider, MD   BP 115/87 mmHg  Pulse 74  Temp(Src) 98.3 F (36.8 C) (Oral)  Resp 18  Ht 4\' 10"  (1.473 m)  Wt 51.71 kg  BMI 23.83 kg/m2  SpO2 99%  LMP 06/03/2015 Physical Exam  Constitutional: She is oriented to person, place, and time. She appears well-developed and well-nourished.  HENT:  Head: Normocephalic and atraumatic.  Mouth/Throat: Oropharynx is clear and moist.  Eyes: Conjunctivae are normal. Pupils are equal, round, and reactive to light. Right eye exhibits no discharge. Left eye exhibits no discharge. No scleral icterus.  Neck: Neck supple.  Cardiovascular: Normal rate, regular rhythm and normal heart sounds.   Pulmonary/Chest: Effort normal and breath sounds normal. No respiratory distress. She has no wheezes. She has no rales.  Abdominal: Soft. There is no tenderness.  Musculoskeletal:  Tenderness diffusely throughout the left paraspinal lumbar musculature. No overt bony tenderness. Moves all extremities with full range of motion  Neurological: She is alert and oriented to person, place, and time.  Cranial Nerves II-XII grossly intact. Motor strength appears baseline, currently slightly decreased secondary to pain only. Sensation is intact to light touch. Gait baseline and able to plantar and dorsiflex her great toes. No ataxia  Skin: Skin is warm and dry.  No rash noted.  Psychiatric: She has a normal mood and affect.  Nursing note and vitals reviewed.   ED Course  Procedures (including critical care time) Labs Review Labs Reviewed  URINALYSIS, ROUTINE W REFLEX MICROSCOPIC (NOT AT [facility]) - Abnormal; Notable for the following:    Hgb urine dipstick TRACE (*)    All other components within normal limits  URINE MICROSCOPIC-ADD ON - Abnormal; Notable for the following:    Squamous Epithelial / LPF 0-5 (*)    Bacteria, UA RARE (*)    All other components within normal limits  PREGNANCY, URINE    Imaging Review No results found. I have personally reviewed and evaluated these images and lab results as part of my medical decision-making.   EKG Interpretation None     Meds given in ED:  Medications  ketorolac (  TORADOL) 30 MG/ML injection 30 mg (30 mg Intramuscular Given 06/26/15 2346)  dexamethasone (DECADRON) injection 10 mg (10 mg Intramuscular Given 06/26/15 2347)    New Prescriptions   KETOROLAC (TORADOL) 10 MG TABLET    Take 1 tablet (10 mg total) by mouth every 6 (six) hours as needed.   PREDNISONE (DELTASONE) 20 MG TABLET    Take 2 tablets (40 mg total) by mouth daily.   Filed Vitals:   06/26/15 2124 06/26/15 2333  BP: 126/90 115/87  Pulse: 88 74  Temp: 98.3 F (36.8 C)   TempSrc: Oral   Resp:  18  Height:  (1.473 m)   Weight: 51.71 kg   SpO2: 100% 99%    MDM  Patient with back pain.  History of chronic back pain with similar symptoms today. Immediate onset after trying to pick up 41-month-old niece last Wednesday. No neurological deficits and normal neuro exam.  Patient can walk but states is painful.  No loss of bowel or bladder control.  No concern for cauda equina, conus medullaris or other spinal cord pathology.  No fever, night sweats, weight loss, h/o cancer, IVDU.  Suspect musculoskeletal injury. RICE protocol NSAIDs, steroids prescribed and discussed with patient. She verbalizes understanding and agrees with  this plan and subsequent follow-up with PCP next week.  Final diagnoses:  Left-sided back pain, unspecified location        Joycie Peek, PA-C 06/27/15 1610  Paula Libra, MD 06/27/15 [phone]

## 2015-06-27 MED ORDER — PREDNISONE 20 MG PO TABS: 40.0000 mg | ORAL_TABLET | Freq: Every day | ORAL | Status: AC

## 2015-06-27 MED ORDER — KETOROLAC TROMETHAMINE 10 MG PO TABS: 10.0000 mg | ORAL_TABLET | Freq: Four times a day (QID) | ORAL | Status: AC | PRN

## 2015-06-27 NOTE — Discharge Instructions (Signed)
Please take your medications as prescribed. Follow-up with your doctor next week for reevaluation. Return to ED for new or worsening symptoms as we discussed.  Back Pain, Adult Back pain is very common in adults.The cause of back pain is rarely dangerous and the pain often gets better over time.The cause of your back pain may not be known. Some common causes of back pain include:  Strain of the muscles or ligaments supporting the spine.  Wear and tear (degeneration) of the spinal disks.  Arthritis.  Direct injury to the back. For many people, back pain may return. Since back pain is rarely dangerous, most people can learn to manage this condition on their own. HOME CARE INSTRUCTIONS Watch your back pain for any changes. The following actions may help to lessen any discomfort you are feeling:  Remain active. It is stressful on your back to sit or stand in one place for long periods of time. Do not sit, drive, or stand in one place for more than 30 minutes at a time. Take short walks on even surfaces as soon as you are able.Try to increase the length of time you walk each day.  Exercise regularly as directed by your health care provider. Exercise helps your back heal faster. It also helps avoid future injury by keeping your muscles strong and flexible.  Do not stay in bed.Resting more than 1-2 days can delay your recovery.  Pay attention to your body when you bend and lift. The most comfortable positions are those that put less stress on your recovering back. Always use proper lifting techniques, including:  Bending your knees.  Keeping the load close to your body.  Avoiding twisting.  Find a comfortable position to sleep. Use a firm mattress and lie on your side with your knees slightly bent. If you lie on your back, put a pillow under your knees.  Avoid feeling anxious or stressed.Stress increases muscle tension and can worsen back pain.It is important to recognize when you are  anxious or stressed and learn ways to manage it, such as with exercise.  Take medicines only as directed by your health care provider. Over-the-counter medicines to reduce pain and inflammation are often the most helpful.Your health care provider may prescribe muscle relaxant drugs.These medicines help dull your pain so you can more quickly return to your normal activities and healthy exercise.  Apply ice to the injured area:  Put ice in a plastic bag.  Place a towel between your skin and the bag.  Leave the ice on for 20 minutes, 2-3 times a day for the first 2-3 days. After that, ice and heat may be alternated to reduce pain and spasms.  Maintain a healthy weight. Excess weight puts extra stress on your back and makes it difficult to maintain good posture. SEEK MEDICAL CARE IF:  You have pain that is not relieved with rest or medicine.  You have increasing pain going down into the legs or buttocks.  You have pain that does not improve in one week.  You have night pain.  You lose weight.  You have a fever or chills. SEEK IMMEDIATE MEDICAL CARE IF:   You develop new bowel or bladder control problems.  You have unusual weakness or numbness in your arms or legs.  You develop nausea or vomiting.  You develop abdominal pain.  You feel faint.   This information is not intended to replace advice given to you by your health care provider. Make sure you discuss  any questions you have with your health care provider.   Document Released: 04/18/2005 Document Revised: 05/09/2014 Document Reviewed: 08/20/2013 Elsevier Interactive Patient Education 2016 [place] Injury Prevention Back injuries can be very painful. They can also be difficult to heal. After having one back injury, you are more likely to injure your back again. It is important to learn how to avoid injuring or re-injuring your back. The following tips can help you to prevent a back injury. WHAT SHOULD I KNOW  ABOUT PHYSICAL FITNESS?  Exercise for 30 minutes per day on most days of the week or as told by your doctor. Make sure to:  Do aerobic exercises, such as walking, jogging, biking, or swimming.  Do exercises that increase balance and strength, such as tai chi and yoga.  Do stretching exercises. This helps with flexibility.  Try to develop strong belly (abdominal) muscles. Your belly muscles help to support your back.  Stay at a healthy weight. This helps to decrease your risk of a back injury. WHAT SHOULD I KNOW ABOUT MY DIET?  Talk with your doctor about your overall diet. Take supplements and vitamins only as told by your doctor.  Talk with your doctor about how much calcium and vitamin D you need each day. These nutrients help to prevent weakening of the bones (osteoporosis).  Include good sources of calcium in your diet, such as:  Dairy products.  Green leafy vegetables.  Products that have had calcium added to them (fortified).  Include good sources of vitamin D in your diet, such as:  Milk.  Foods that have had vitamin D added to them. WHAT SHOULD I KNOW ABOUT MY POSTURE?  Sit up straight and stand up straight. Avoid leaning forward when you sit or hunching over when you stand.  Choose chairs that have good low-back (lumbar) support.  If you work at a desk, sit close to it so you do not need to lean over. Keep your chin tucked in. Keep your neck drawn back. Keep your elbows bent so your arms look like the letter "L" (right angle).  Sit high and close to the steering wheel when you drive. Add a low-back support to your car seat, if needed.  Avoid sitting or standing in one position for very long. Take breaks to get up, stretch, and walk around at least one time every hour. Take breaks every hour if you are driving for long periods of time.  Sleep on your side with your knees slightly bent, or sleep on your back with a pillow under your knees. Do not lie on the front of  your body to sleep. WHAT SHOULD I KNOW ABOUT LIFTING, TWISTING, AND REACHING Lifting and Heavy Lifting  Avoid heavy lifting, especially lifting over and over again. If you must do heavy lifting:  Stretch before lifting.  Work slowly.  Rest between lifts.  Use a tool such as a cart or a dolly to move objects if one is available.  Make several small trips instead of carrying one heavy load.  Ask for help when you need it, especially when moving big objects.  Follow these steps when lifting:  Stand with your feet shoulder-width apart.  Get as close to the object as you can. Do not pick up a heavy object that is far from your body.  Use handles or lifting straps if they are available.  Bend at your knees. Squat down, but keep your heels off the floor.  Keep your shoulders  back. Keep your chin tucked in. Keep your back straight.  Lift the object slowly while you tighten the muscles in your legs, belly, and butt. Keep the object as close to the center of your body as possible.  Follow these steps when putting down a heavy load:  Stand with your feet shoulder-width apart.  Lower the object slowly while you tighten the muscles in your legs, belly, and butt. Keep the object as close to the center of your body as possible.  Keep your shoulders back. Keep your chin tucked in. Keep your back straight.  Bend at your knees. Squat down, but keep your heels off the floor.  Use handles or lifting straps if they are available. Twisting and Reaching  Avoid lifting heavy objects above your waist.  Do not twist at your waist while you are lifting or carrying a load. If you need to turn, move your feet.  Do not bend over without bending at your knees.  Avoid reaching over your head, across a table, or for an object on a high surface.  WHAT ARE SOME OTHER TIPS?  Avoid wet floors and icy ground. Keep sidewalks clear of ice to prevent falls.   Do not sleep on a mattress that is too  soft or too hard.   Keep items that you use often within easy reach.   Put heavier objects on shelves at waist level, and put lighter objects on lower or higher shelves.  Find ways to lower your stress, such as:  Exercise.  Massage.  Relaxation techniques.  Talk with your doctor if you feel anxious or depressed. These conditions can make back pain worse.  Wear flat heel shoes with cushioned soles.  Avoid making quick (sudden) movements.  Use both shoulder straps when carrying a backpack.  Do not use any tobacco products, including cigarettes, chewing tobacco, or electronic cigarettes. If you need help quitting, ask your doctor.   This information is not intended to replace advice given to you by your health care provider. Make sure you discuss any questions you have with your health care provider.   Document Released: 10/05/2007 Document Revised: 09/02/2014 Document Reviewed: 04/22/2014 Elsevier Interactive Patient Education [employer].

## 2017-02-18 ENCOUNTER — Emergency Department (HOSPITAL_BASED_OUTPATIENT_CLINIC_OR_DEPARTMENT_OTHER): Payer: Medicaid Other

## 2017-02-18 ENCOUNTER — Emergency Department (HOSPITAL_BASED_OUTPATIENT_CLINIC_OR_DEPARTMENT_OTHER)
Admission: EM | Admit: 2017-02-18 | Discharge: 2017-02-18 | Disposition: A | Discharge status: Home / Self Care | Payer: Medicaid Other | Attending: Emergency Medicine | Admitting: Emergency Medicine

## 2017-02-18 ENCOUNTER — Encounter (HOSPITAL_BASED_OUTPATIENT_CLINIC_OR_DEPARTMENT_OTHER): Payer: Self-pay | Admitting: Emergency Medicine

## 2017-02-18 DIAGNOSIS — F909 Attention-deficit hyperactivity disorder, unspecified type: Secondary | ICD-10-CM | POA: Insufficient documentation

## 2017-02-18 DIAGNOSIS — R29898 Other symptoms and signs involving the musculoskeletal system: Secondary | ICD-10-CM | POA: Diagnosis not present

## 2017-02-18 DIAGNOSIS — R079 Chest pain, unspecified: Secondary | ICD-10-CM | POA: Diagnosis not present

## 2017-02-18 DIAGNOSIS — R51 Headache: Secondary | ICD-10-CM | POA: Diagnosis not present

## 2017-02-18 DIAGNOSIS — Z91040 Latex allergy status: Secondary | ICD-10-CM | POA: Insufficient documentation

## 2017-02-18 DIAGNOSIS — J45909 Unspecified asthma, uncomplicated: Secondary | ICD-10-CM | POA: Diagnosis not present

## 2017-02-18 DIAGNOSIS — Z79899 Other long term (current) drug therapy: Secondary | ICD-10-CM | POA: Insufficient documentation

## 2017-02-18 HISTORY — DX: Transient cerebral ischemic attack, unspecified: G45.9

## 2017-02-18 LAB — COMPREHENSIVE METABOLIC PANEL
ALK PHOS: 72 U/L (ref 38–126)
ALT: 12 U/L — ABNORMAL LOW (ref 14–54)
ANION GAP: 9 (ref 5–15)
AST: 16 U/L (ref 15–41)
Albumin: 4.5 g/dL (ref 3.5–5.0)
BUN: 10 mg/dL (ref 6–20)
CALCIUM: 9.1 mg/dL (ref 8.9–10.3)
CO2: 21 mmol/L — ABNORMAL LOW (ref 22–32)
Chloride: 106 mmol/L (ref 101–111)
Creatinine, Ser: 0.74 mg/dL (ref 0.44–1.00)
Glucose, Bld: 96 mg/dL (ref 65–99)
Potassium: 3.3 mmol/L — ABNORMAL LOW (ref 3.5–5.1)
Sodium: 136 mmol/L (ref 135–145)
TOTAL PROTEIN: 7.7 g/dL (ref 6.5–8.1)
Total Bilirubin: 0.4 mg/dL (ref 0.3–1.2)

## 2017-02-18 LAB — CBC WITH DIFFERENTIAL/PLATELET
Basophils Absolute: 0 10*3/uL (ref 0.0–0.1)
Basophils Relative: 0 %
EOS ABS: 0.1 10*3/uL (ref 0.0–0.7)
Eosinophils Relative: 1 %
HEMATOCRIT: 35.3 % — AB (ref 36.0–46.0)
HEMOGLOBIN: 11.5 g/dL — AB (ref 12.0–15.0)
LYMPHS ABS: 2.8 10*3/uL (ref 0.7–4.0)
LYMPHS PCT: 35 %
MCH: 25.3 pg — AB (ref 26.0–34.0)
MCHC: 32.6 g/dL (ref 30.0–36.0)
MCV: 77.8 fL — AB (ref 78.0–100.0)
MONOS PCT: 10 %
Monocytes Absolute: 0.8 10*3/uL (ref 0.1–1.0)
NEUTROS ABS: 4.2 10*3/uL (ref 1.7–7.7)
NEUTROS PCT: 54 %
Platelets: 331 10*3/uL (ref 150–400)
RBC: 4.54 MIL/uL (ref 3.87–5.11)
RDW: 15.7 % — ABNORMAL HIGH (ref 11.5–15.5)
WBC: 7.9 10*3/uL (ref 4.0–10.5)

## 2017-02-18 LAB — URINALYSIS, ROUTINE W REFLEX MICROSCOPIC
BILIRUBIN URINE: NEGATIVE
GLUCOSE, UA: NEGATIVE mg/dL
Hgb urine dipstick: NEGATIVE
KETONES UR: 15 mg/dL — AB
Leukocytes, UA: NEGATIVE
Nitrite: NEGATIVE
PH: 7.5 (ref 5.0–8.0)
Protein, ur: NEGATIVE mg/dL
SPECIFIC GRAVITY, URINE: 1.01 (ref 1.005–1.030)

## 2017-02-18 LAB — PREGNANCY, URINE: Preg Test, Ur: NEGATIVE

## 2017-02-18 LAB — TROPONIN I: Troponin I: <0.03 ng/mL (ref <0.03)

## 2017-02-18 MED ORDER — METOCLOPRAMIDE HCL 5 MG/ML IJ SOLN
10.0000 mg | Freq: Once | INTRAMUSCULAR | Status: AC
  Administered 2017-02-18: 10 mg via INTRAVENOUS
  Filled 2017-02-18: qty 2

## 2017-02-18 MED ORDER — DIPHENHYDRAMINE HCL 50 MG/ML IJ SOLN
25.0000 mg | Freq: Once | INTRAMUSCULAR | Status: AC
  Administered 2017-02-18: 25 mg via INTRAVENOUS
  Filled 2017-02-18: qty 1

## 2017-02-18 NOTE — ED Provider Notes (Signed)
[facility] EMERGENCY DEPARTMENT Provider Note   CSN: [id number] Arrival date & time: 02/18/17  2038     History   Chief Complaint Chief Complaint  Patient presents with  . Weakness    HPI Michelle Singh is a 36 y.o. female. Patient complains of symptoms starting at 5:30 today she was at work. States she felt chest pain, shoulder pain, headache, arm tingling, arm weakness, facial numbness. Initially states she's never had episodes like this before. Then recants and states she had an episode 2 years ago and was admitted with "TIA" at [facility]  Review of her chart there shows normal MRIs normal studies and ultimate diagnosis of conversion reaction.  No history of hypertension diabetes vascular disease smoking.  HPI  Past Medical History:  Diagnosis Date  . Anemia   . Asthma   . Attention deficit hyperactivity disorder   . Chronic back pain   . Chronic neck pain   . Chronic pain   . Degenerative disc disease   . Hyperlipemia   . Irritable bowel syndrome (IBS)   . Lung mass   . Migraine   . Reflux   . Restless leg syndrome   . TIA (transient ischemic attack)     There are no active problems to display for this patient.   Past Surgical History:  Procedure Laterality Date  . CHOLECYSTECTOMY    . KIDNEY STONE SURGERY    . TUBAL LIGATION  states has been reversed  . tubal ligation reversal      OB History    No data available       Home Medications    Prior to Admission medications   Medication Sig Start Date End Date Taking? Authorizing Provider  albuterol (PROVENTIL HFA;VENTOLIN HFA) 108 (90 BASE) MCG/ACT inhaler Inhale 2 puffs into the lungs every 4 (four) hours as needed. For cough or wheezing    [provider]  budesonide-formoterol (SYMBICORT) 160-4.5 MCG/ACT inhaler Inhale 2 puffs into the lungs 2 (two) times daily.    [provider]  ciprofloxacin (CIPRO) 500 MG tablet Take 1 tablet (500 mg total) by  mouth every 12 (twelve) hours. 04/07/13   Rolland Porter, MD  cyanocobalamin (,VITAMIN B-12,) 1000 MCG/ML injection Inject 1,000 mcg into the muscle every 30 (thirty) days.    [provider]  desvenlafaxine (PRISTIQ) 50 MG 24 hr tablet Take 50 mg by mouth daily.    [provider]  DULoxetine (CYMBALTA) 20 MG capsule Take 60 mg by mouth daily.     [provider]  Fluticasone-Salmeterol (ADVAIR) 250-50 MCG/DOSE AEPB Inhale 1 puff into the lungs every 12 (twelve) hours.    [provider]  HYDROcodone-acetaminophen (NORCO/VICODIN) 5-325 MG per tablet Take 1 tablet by mouth every 4 (four) hours as needed. 01/01/15   Rancour, Jeannett Senior, MD  ibuprofen (ADVIL,MOTRIN) 800 MG tablet Take 1 tablet (800 mg total) by mouth 3 (three) times daily. 01/01/15   Rancour, Jeannett Senior, MD  ketorolac (TORADOL) 10 MG tablet Take 1 tablet (10 mg total) by mouth every 6 (six) hours as needed. 06/27/15   Cartner, Sharlet Salina, PA-C  methocarbamol (ROBAXIN) 750 MG tablet Take 750 mg by mouth 4 (four) times daily.    [provider]  methylphenidate (RITALIN) 10 MG tablet Take 10 mg by mouth 3 (three) times daily as needed. For concentration    [provider]  nortriptyline (PAMELOR) 10 MG capsule Take 20 mg by mouth at bedtime.    [provider]  omeprazole (PRILOSEC) 40 MG capsule Take 40 mg by mouth at bedtime.    [provider]  ondansetron (ZOFRAN ODT) 4 MG disintegrating tablet Take 1 tablet (4 mg total) by mouth every 8 (eight) hours as needed for nausea. 04/07/13   Rolland Porter, MD  ondansetron (ZOFRAN) 4 MG tablet Take 1 tablet (4 mg total) by mouth every 6 (six) hours. 01/01/15   Rancour, Jeannett Senior, MD  oxyCODONE-acetaminophen (PERCOCET/ROXICET) 5-325 MG per tablet Take 1-2 tablets by mouth every 4 (four) hours as needed (for pain). 04/11/13   Molpus, John, MD  predniSONE (DELTASONE) 20 MG tablet Take 2 tablets (40 mg total) by mouth daily. 06/27/15   Cartner,  Sharlet Salina, PA-C  SUMAtriptan (IMITREX) 100 MG tablet Take 100 mg by mouth every 2 (two) hours as needed. For migraine    [provider]  tamsulosin (FLOMAX) 0.4 MG CAPS capsule Take 1 capsule (0.4 mg total) by mouth daily. 04/07/13   Rolland Porter, MD  vitamin B-12 (CYANOCOBALAMIN) 1000 MCG tablet Take 2,000 mcg by mouth daily.     [provider]    Family History No family history on file.  Social History Social History  Substance Use Topics  . Smoking status: Never Smoker  . Smokeless tobacco: Never Used  . Alcohol use No     Allergies   Tramadol; Milk-related compounds; and Latex   Review of Systems Review of Systems  Constitutional: Negative for appetite change, chills, diaphoresis, fatigue and fever.  HENT: Negative for mouth sores, sore throat and trouble swallowing.   Eyes: Negative for visual disturbance.  Respiratory: Negative for cough, chest tightness, shortness of breath and wheezing.   Cardiovascular: Positive for chest pain.  Gastrointestinal: Negative for abdominal distention, abdominal pain, diarrhea, nausea and vomiting.  Endocrine: Negative for polydipsia, polyphagia and polyuria.  Genitourinary: Negative for dysuria, frequency and hematuria.  Musculoskeletal: Negative for gait problem.  Skin: Negative for color change, pallor and rash.  Neurological: Positive for weakness, numbness and headaches. Negative for dizziness, syncope and light-headedness.  Hematological: Does not bruise/bleed easily.  Psychiatric/Behavioral: Negative for behavioral problems and confusion.     Physical Exam Updated Vital Signs BP (!) 118/92 (BP Location: Right Arm)   Pulse 75   Temp 97.9 F (36.6 C) (Oral)   Resp 18   LMP 01/30/2017   SpO2 100%   Physical Exam  Constitutional: She is oriented to person, place, and time. She appears well-developed and well-nourished. No distress.  HENT:  Head: Normocephalic.  Eyes: Pupils are equal, round, and reactive  to light. Conjunctivae are normal. No scleral icterus.  Neck: Normal range of motion. Neck supple. No thyromegaly present.  Cardiovascular: Normal rate and regular rhythm.  Exam reveals no gallop and no friction rub.   No murmur heard. Pulmonary/Chest: Effort normal and breath sounds normal. No respiratory distress. She has no wheezes. She has no rales.  Abdominal: Soft. Bowel sounds are normal. She exhibits no distension. There is no tenderness. There is no rebound.  Musculoskeletal: Normal range of motion.  Neurological: She is alert and oriented to person, place, and time.  Symmetric cranial nerves exception of decreased sensation left cheek did not check her movement abnormalities no visual field deficits. No carotid bruits. Normal strength to the 4 extremities with exception of some inconsistent effort dependent left upper infection weakness and? Pronator drift.  Skin: Skin is warm and dry. No rash noted.  Psychiatric: She has a normal mood and affect. Her behavior is  normal.     ED Treatments / Results  Labs (all labs ordered are listed, but only abnormal results are displayed) Labs Reviewed  CBC WITH DIFFERENTIAL/PLATELET - Abnormal; Notable for the following:       Result Value   Hemoglobin 11.5 (*)    HCT 35.3 (*)    MCV 77.8 (*)    MCH 25.3 (*)    RDW 15.7 (*)    All other components within normal limits  COMPREHENSIVE METABOLIC PANEL - Abnormal; Notable for the following:    Potassium 3.3 (*)    CO2 21 (*)    ALT 12 (*)    All other components within normal limits  URINALYSIS, ROUTINE W REFLEX MICROSCOPIC - Abnormal; Notable for the following:    Ketones, ur 15 (*)    All other components within normal limits  TROPONIN I  PREGNANCY, URINE    EKG  EKG Interpretation None       Radiology Dg Chest 1 View  Result Date: 02/18/2017 CLINICAL DATA:  Left-sided weakness and chest pain starting at 5:30 today. EXAM: CHEST 1 VIEW COMPARISON:  01/01/2015 FINDINGS: The  heart size and mediastinal contours are within normal limits. Both lungs are clear. The visualized skeletal structures are unremarkable. IMPRESSION: No active disease. Electronically Signed   By: Burman Nieves M.D.   On: 02/18/2017 21:33   Ct Head Wo Contrast  Result Date: 02/18/2017 CLINICAL DATA:  Left-sided weakness and chest pain starting at 5:30 today. EXAM: CT HEAD WITHOUT CONTRAST TECHNIQUE: Contiguous axial images were obtained from the base of the skull through the vertex without intravenous contrast. COMPARISON:  MRI brain 01/12/2015.  CT head 01/11/2015 FINDINGS: Brain: There is subtle effacement of right frontotemporal sulci with slight loss of gray-white matter junction in the temporal region. This may indicate changes of early acute infarct. MRI correlation may be useful if clinically indicated. No midline shift. No ventricular dilatation or effacement. Basal cisterns are not effaced. No abnormal extra-axial fluid collections. No acute intracranial hemorrhage. Vascular: No hyperdense vessel or unexpected calcification. Skull: Normal. Negative for fracture or focal lesion. Sinuses/Orbits: No acute finding. Other: None. IMPRESSION: Subtle loss of gray-white matter junction and effacement of the right frontotemporal sulci. May indicate early changes of acute infarct. Consider MRI correlation if clinically indicated. No midline shift. No acute intracranial hemorrhage. Electronically Signed   By: Burman Nieves M.D.   On: 02/18/2017 21:33    Procedures Procedures (including critical care time)  Medications Ordered in ED Medications  diphenhydrAMINE (BENADRYL) injection 25 mg (25 mg Intravenous Given 02/18/17 2212)  metoCLOPramide (REGLAN) injection 10 mg (10 mg Intravenous Given 02/18/17 2214)     Initial Impression / Assessment and Plan / ED Course  I have reviewed the triage vital signs and the nursing notes.  Pertinent labs & imaging results that were available during my care of  the patient were reviewed by me and considered in my medical decision making (see chart for details).    Patient with NIH of 1. Discussed with neurologist on call. Would not be an indication for acute lytic therapy. CT and CTA recommended. CT shows no acute findings with exception of very subtle abnormality of the parenchyma of the right. I discussed this with her recommended transfer to stroke center for further evaluation. She states ultimately that she would refuse TPA would refuse admission and wants to be discharged home. Told further evaluation is incomplete. I  recommend serial enzyme testing, and MRI. She declines. Discuss death  and permanent neurological disability with her. She continues to decline in status upon leaving AGAINST MEDICAL ADVICE   Final Clinical Impressions(s) / ED Diagnoses   Final diagnoses:  Arm weakness    New Prescriptions Discharge Medication List as of 02/18/2017 10:53 PM       Rolland PorterJames, Lucrecia Mcphearson, MD 02/18/17 2335

## 2017-02-18 NOTE — Discharge Instructions (Signed)
Dr. Fayrene FearingJames recommendation is [facility] tonight.  Undiagnosed and untreated strokes can lead to death, and permanent disability.

## 2017-02-18 NOTE — ED Notes (Signed)
Alert, NAD, calm, interactive, resps e/u, speaking in clear complete sentences, no dyspnea noted, skin W&D, VSS, c/o and/or admits to: L neck and arm pain, rates 9/10, also numbness, tingling, nausea, dizziness and sob, pt guarding/bracing movements of head, neck and L arm d/t acute pain, poor exam d/t acute pain, (denies: nothing). A&Ox4, PERRLA, MAEx4. Pt to CT/xray at this time. [facility]

## 2017-02-18 NOTE — ED Notes (Signed)
ED Provider at bedside. 

## 2017-02-18 NOTE — ED Notes (Signed)
Patient called me to her room and requested to go home and will [facility].  MD informed her that she needs an MRI because she is exhibiting signs of stroke.  Patient insisted that she wants to go home to take her daughter home.  MD informed her that it is against medical advice but pt still insisted of going home.

## 2017-02-18 NOTE — ED Triage Notes (Addendum)
PT presents with c/o left sided weakness and chest pain that started at 530 today. PT states she was at work and refused to call EMS b/c she had to keep working. But then drove here.  Weakness noted to left hand

## 2018-05-26 IMAGING — CT CT HEAD W/O CM
3 series · 16 of 47 positions shown, 19 images · non-contrast
Comparison: MRI brain 01/12/2015.  CT head 01/11/2015

CLINICAL DATA: Left-sided weakness and chest pain starting at [DATE]
today.

EXAM:
CT HEAD WITHOUT CONTRAST
TECHNIQUE: Contiguous axial images were obtained from the base of the skull
through the vertex without intravenous contrast.

[Series 2: head wo · axial · 0.42mm/px · z∈[-170,-35]mm · 10 of 33 slices shown, 13 images]
[im 3/33  brain]
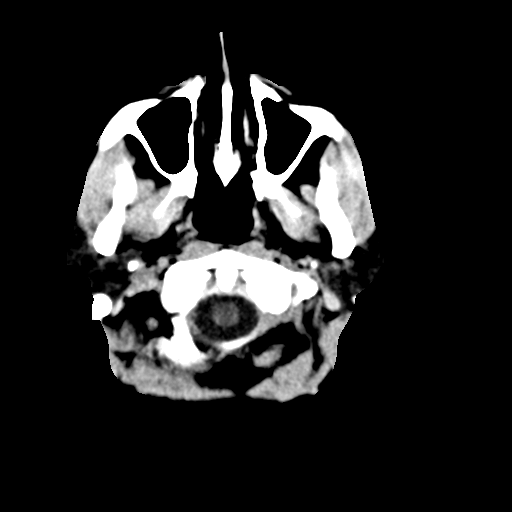
[im 3/33  bone]
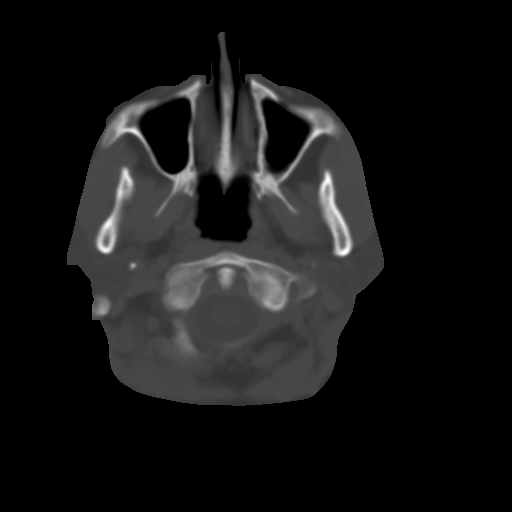
[im 6/33  brain]
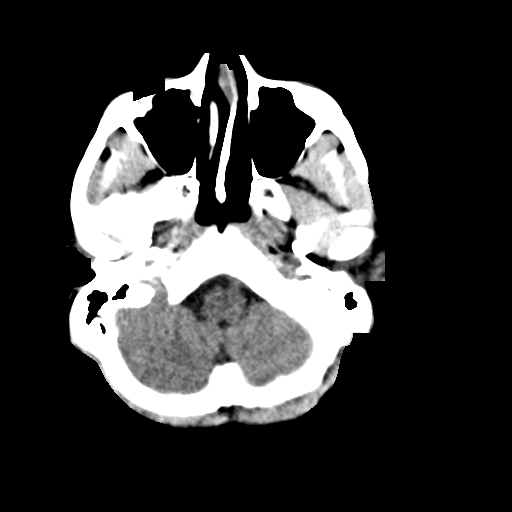
[im 9/33  brain]
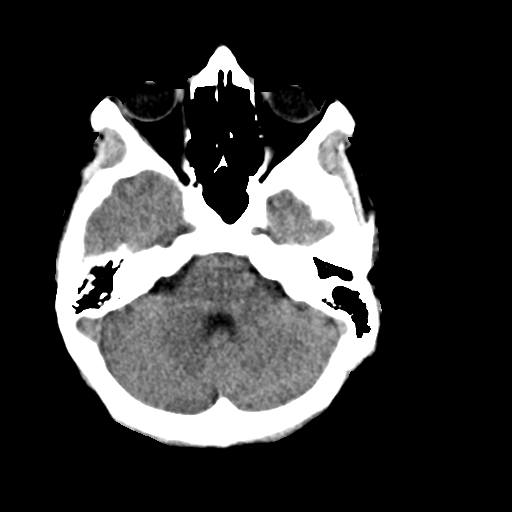
[im 12/33  brain]
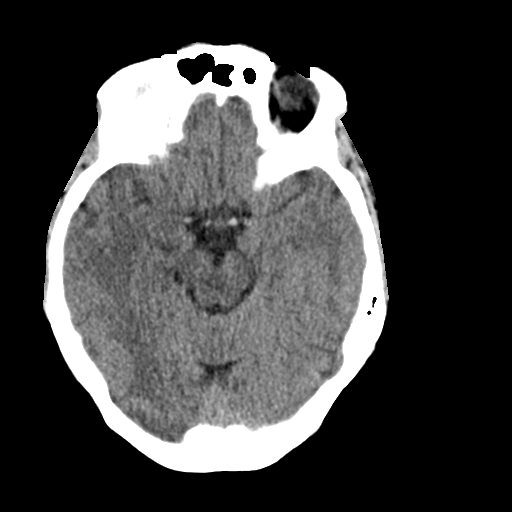
[im 15/33  brain]
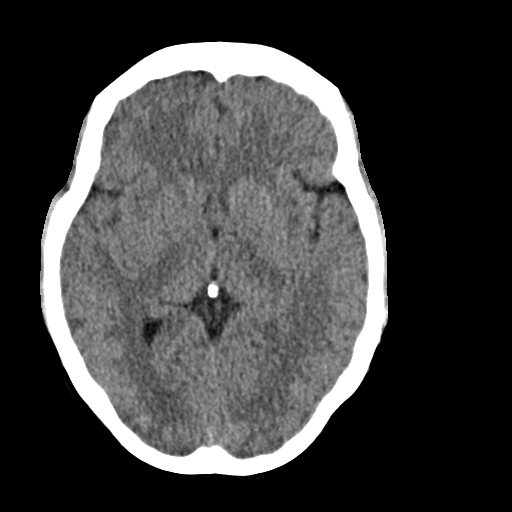
[im 15/33  bone]
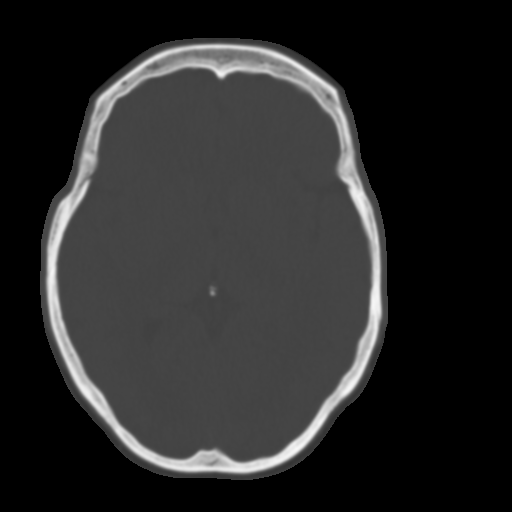
[im 18/33  brain]
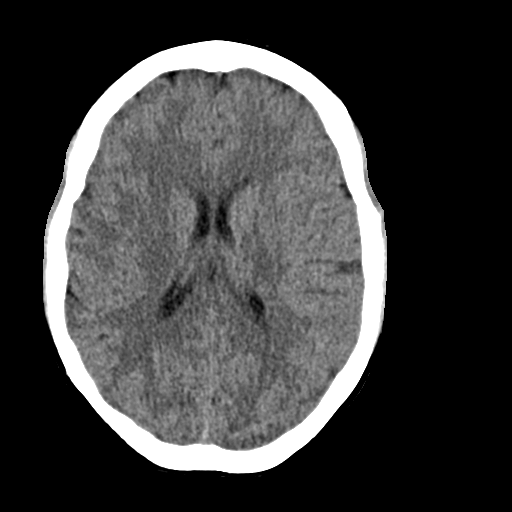
[im 21/33  brain]
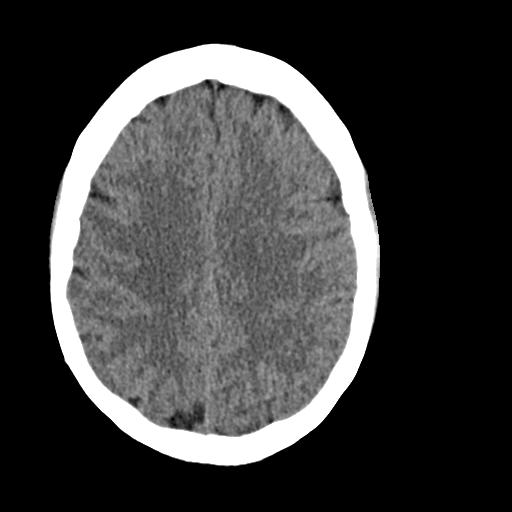
[im 25/33  brain]
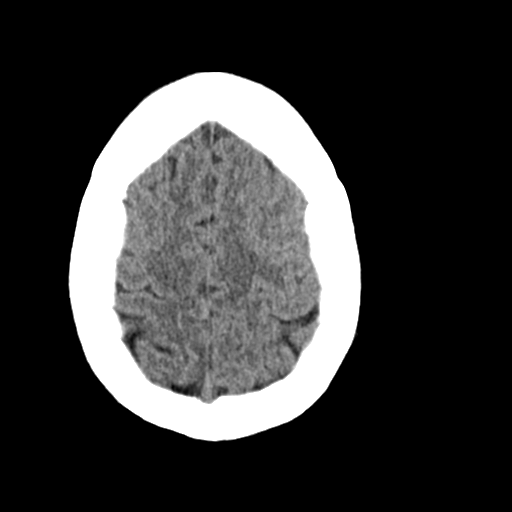
[im 27/33  brain]
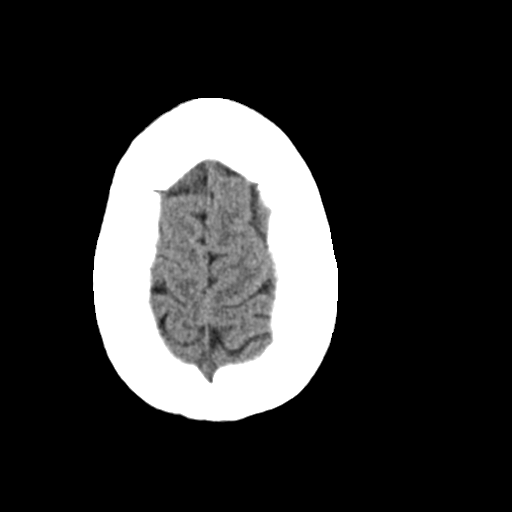
[im 27/33  bone]
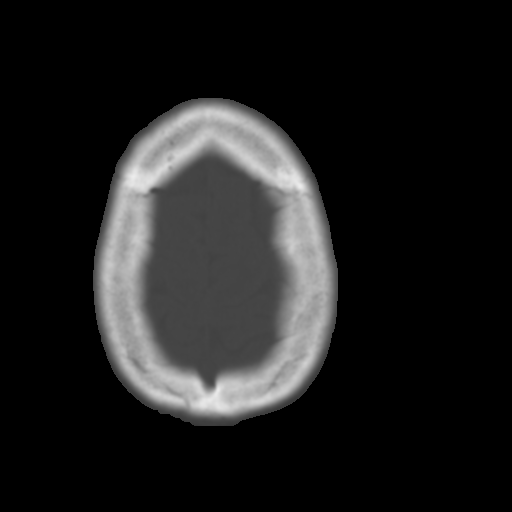
[im 30/33  brain]
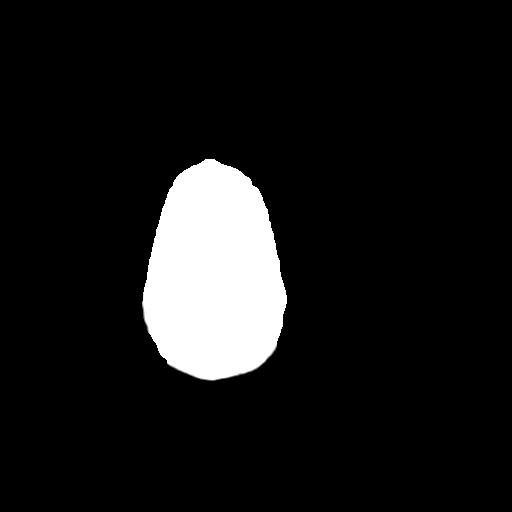

[Series 4: coronal soft · coronal · 0.32mm/px · 3 of 61 slices shown]
[im 21/61  brain]
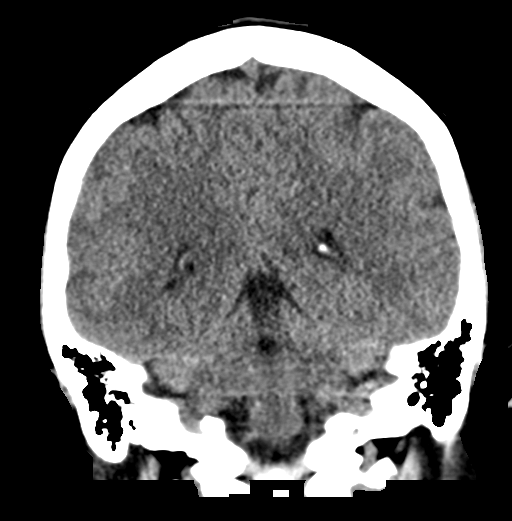
[im 27/61  brain]
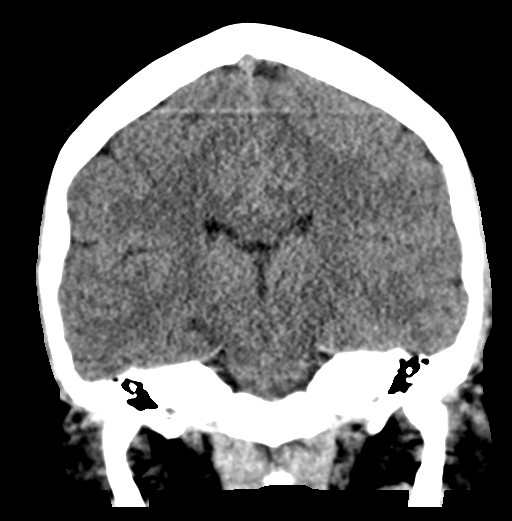
[im 34/61  brain]
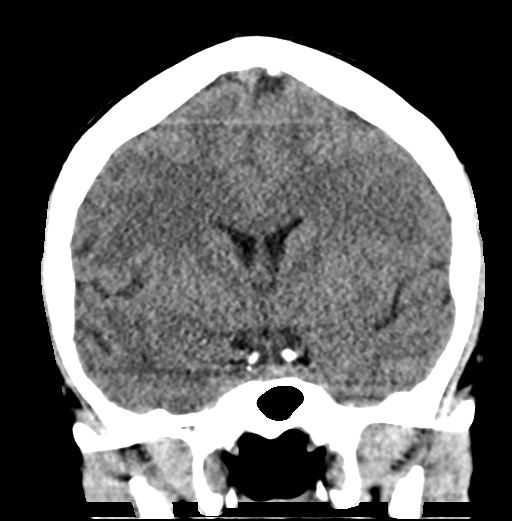

[Series 5: sag soft · sagittal · 0.31mm/px · 3 of 53 slices shown]
[im 18/53  brain]
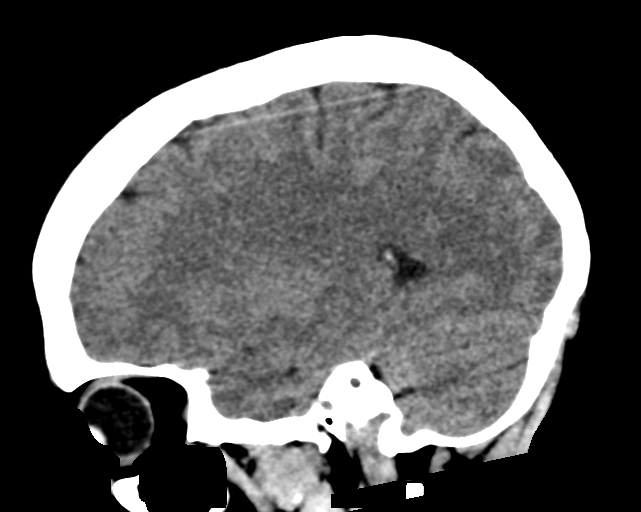
[im 27/53  brain]
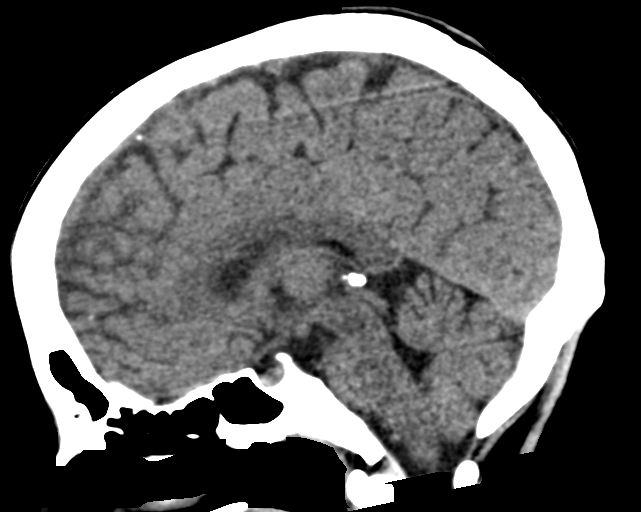
[im 35/53  brain]
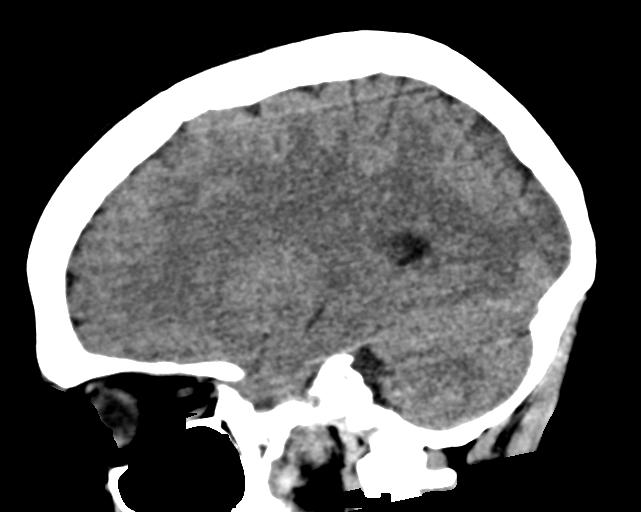

[16 of 47 positions shown; findings below may reference images not displayed]

FINDINGS: Brain: There is subtle effacement of right frontotemporal sulci with
slight loss of gray-white matter junction in the temporal region.
This may indicate changes of early acute infarct. MRI correlation
may be useful if clinically indicated. No midline shift. No
ventricular dilatation or effacement. Basal cisterns are not
effaced. No abnormal extra-axial fluid collections. No acute
intracranial hemorrhage.

Vascular: No hyperdense vessel or unexpected calcification.

Skull: Normal. Negative for fracture or focal lesion.

Sinuses/Orbits: No acute finding.

Other: None.
IMPRESSION: Subtle loss of gray-white matter junction and effacement of the
right frontotemporal sulci. May indicate early changes of acute
infarct. Consider MRI correlation if clinically indicated. No
midline shift. No acute intracranial hemorrhage.

## 2021-10-28 ENCOUNTER — Emergency Department (HOSPITAL_BASED_OUTPATIENT_CLINIC_OR_DEPARTMENT_OTHER): Payer: Medicaid Other

## 2021-10-28 ENCOUNTER — Other Ambulatory Visit: Payer: Self-pay

## 2021-10-28 ENCOUNTER — Emergency Department (HOSPITAL_BASED_OUTPATIENT_CLINIC_OR_DEPARTMENT_OTHER)
Admission: EM | Admit: 2021-10-28 | Discharge: 2021-10-28 | Disposition: A | Discharge status: Home / Self Care | Payer: Medicaid Other | Attending: Emergency Medicine | Admitting: Emergency Medicine

## 2021-10-28 DIAGNOSIS — R202 Paresthesia of skin: Secondary | ICD-10-CM | POA: Diagnosis not present

## 2021-10-28 DIAGNOSIS — R61 Generalized hyperhidrosis: Secondary | ICD-10-CM | POA: Diagnosis not present

## 2021-10-28 DIAGNOSIS — J45909 Unspecified asthma, uncomplicated: Secondary | ICD-10-CM | POA: Diagnosis not present

## 2021-10-28 DIAGNOSIS — E876 Hypokalemia: Secondary | ICD-10-CM | POA: Diagnosis not present

## 2021-10-28 DIAGNOSIS — R0789 Other chest pain: Secondary | ICD-10-CM | POA: Diagnosis not present

## 2021-10-28 DIAGNOSIS — Z91040 Latex allergy status: Secondary | ICD-10-CM | POA: Diagnosis not present

## 2021-10-28 DIAGNOSIS — Z8673 Personal history of transient ischemic attack (TIA), and cerebral infarction without residual deficits: Secondary | ICD-10-CM | POA: Insufficient documentation

## 2021-10-28 DIAGNOSIS — Z7951 Long term (current) use of inhaled steroids: Secondary | ICD-10-CM | POA: Diagnosis not present

## 2021-10-28 DIAGNOSIS — R002 Palpitations: Secondary | ICD-10-CM | POA: Diagnosis present

## 2021-10-28 LAB — CBC
HCT: 38.3 % (ref 36.0–46.0)
Hemoglobin: 13.3 g/dL (ref 12.0–15.0)
MCH: 30.4 pg (ref 26.0–34.0)
MCHC: 34.7 g/dL (ref 30.0–36.0)
MCV: 87.4 fL (ref 80.0–100.0)
Platelets: 313 10*3/uL (ref 150–400)
RBC: 4.38 MIL/uL (ref 3.87–5.11)
RDW: 12.3 % (ref 11.5–15.5)
WBC: 8.6 10*3/uL (ref 4.0–10.5)
nRBC: 0 % (ref 0.0–0.2)

## 2021-10-28 LAB — BASIC METABOLIC PANEL
Anion gap: 9 (ref 5–15)
BUN: 10 mg/dL (ref 6–20)
CO2: 25 mmol/L (ref 22–32)
Calcium: 8.9 mg/dL (ref 8.9–10.3)
Chloride: 101 mmol/L (ref 98–111)
Creatinine, Ser: 0.77 mg/dL (ref 0.44–1.00)
GFR, Estimated: >60 mL/min (ref >60)
Glucose, Bld: 85 mg/dL (ref 70–99)
Potassium: 3.3 mmol/L — ABNORMAL LOW (ref 3.5–5.1)
Sodium: 135 mmol/L (ref 135–145)

## 2021-10-28 LAB — TROPONIN I (HIGH SENSITIVITY)
Troponin I (High Sensitivity): 2 ng/L (ref <18)
Troponin I (High Sensitivity): <2 ng/L (ref <18)

## 2021-10-28 LAB — BRAIN NATRIURETIC PEPTIDE: B Natriuretic Peptide: 11.9 pg/mL (ref 0.0–100.0)

## 2021-10-28 MED ORDER — POTASSIUM CHLORIDE CRYS ER 20 MEQ PO TBCR
40.0000 meq | EXTENDED_RELEASE_TABLET | Freq: Once | ORAL | Status: AC
  Administered 2021-10-28: 40 meq via ORAL
  Filled 2021-10-28: qty 2

## 2021-10-28 MED ORDER — MAGNESIUM SULFATE IN D5W 1-5 GM/100ML-% IV SOLN
1.0000 g | Freq: Once | INTRAVENOUS | Status: AC
Start: 2021-10-28 — End: 2021-10-28
  Administered 2021-10-28: 1 g via INTRAVENOUS
  Filled 2021-10-28: qty 100

## 2021-10-28 NOTE — ED Triage Notes (Signed)
Pt reports episode of chest pain, left arm weakness and shob while walking around store tonight. Pt states she had iron infusion mon and had same symptoms then.

## 2021-10-28 NOTE — Discharge Instructions (Addendum)
It was a pleasure caring for you today in the emergency department.  Please return to the emergency department for any worsening or worrisome symptoms.  Please see your PCP for follow-up, repeat potassium level.

## 2021-10-28 NOTE — ED Provider Notes (Signed)
[facility] EMERGENCY DEPARTMENT Provider Note   CSN: [id number] Arrival date & time: 10/28/21  2010     History  Chief Complaint  Patient presents with   Chest Pain    Michelle Singh is a 41 y.o. female.  Patient as above with significant medical history as below, including IBS, ADHD, HLD, migraines who presents to the ED with complaint of chest pain.  Patient reports that she was shopping just prior to arrival, began to feel diaphoretic, chest pain, palpitations, tingling in her arms and legs.  Felt very warm.  She is experienced the symptoms multiple times in the past.  She has been seen by cardiology in the past, she had a loop recorder in the past which was unremarkable per the patient.  Reports her symptoms have greatly improved since the onset.  No nausea or vomiting.  No fevers or chills.  No falls or injuries.  No seizure activity.    Past Medical History:  Diagnosis Date   Anemia    Asthma    Attention deficit hyperactivity disorder    Chronic back pain    Chronic neck pain    Chronic pain    Degenerative disc disease    Hyperlipemia    Irritable bowel syndrome (IBS)    Lung mass    Migraine    Reflux    Restless leg syndrome    TIA (transient ischemic attack)     Past Surgical History:  Procedure Laterality Date   CHOLECYSTECTOMY     KIDNEY STONE SURGERY     TUBAL LIGATION  states has been reversed   tubal ligation reversal       The history is provided by the patient. No language interpreter was used.  Chest Pain Associated symptoms: diaphoresis and shortness of breath   Associated symptoms: no abdominal pain, no back pain, no cough, no dysphagia, no fever, no headache, no nausea and no palpitations        Home Medications Prior to Admission medications   Medication Sig Start Date End Date Taking? Authorizing Provider  albuterol (PROVENTIL HFA;VENTOLIN HFA) 108 (90 BASE) MCG/ACT inhaler Inhale 2 puffs into the lungs every 4 (four) hours as  needed. For cough or wheezing    [provider]  budesonide-formoterol (SYMBICORT) 160-4.5 MCG/ACT inhaler Inhale 2 puffs into the lungs 2 (two) times daily.    [provider]  ciprofloxacin (CIPRO) 500 MG tablet Take 1 tablet (500 mg total) by mouth every 12 (twelve) hours. 04/07/13   Rolland Porter, MD  cyanocobalamin (,VITAMIN B-12,) 1000 MCG/ML injection Inject 1,000 mcg into the muscle every 30 (thirty) days.    [provider]  desvenlafaxine (PRISTIQ) 50 MG 24 hr tablet Take 50 mg by mouth daily.    [provider]  DULoxetine (CYMBALTA) 20 MG capsule Take 60 mg by mouth daily.     [provider]  Fluticasone-Salmeterol (ADVAIR) 250-50 MCG/DOSE AEPB Inhale 1 puff into the lungs every 12 (twelve) hours.    [provider]  HYDROcodone-acetaminophen (NORCO/VICODIN) 5-325 MG per tablet Take 1 tablet by mouth every 4 (four) hours as needed. 01/01/15   Rancour, Jeannett Senior, MD  ibuprofen (ADVIL,MOTRIN) 800 MG tablet Take 1 tablet (800 mg total) by mouth 3 (three) times daily. 01/01/15   Rancour, Jeannett Senior, MD  ketorolac (TORADOL) 10 MG tablet Take 1 tablet (10 mg total) by mouth every 6 (six) hours as needed. 06/27/15   Cartner, Sharlet Salina, PA-C  methocarbamol (ROBAXIN) 750 MG tablet Take  750 mg by mouth 4 (four) times daily.    [provider]  methylphenidate (RITALIN) 10 MG tablet Take 10 mg by mouth 3 (three) times daily as needed. For concentration    [provider]  nortriptyline (PAMELOR) 10 MG capsule Take 20 mg by mouth at bedtime.    [provider]  omeprazole (PRILOSEC) 40 MG capsule Take 40 mg by mouth at bedtime.    [provider]  ondansetron (ZOFRAN ODT) 4 MG disintegrating tablet Take 1 tablet (4 mg total) by mouth every 8 (eight) hours as needed for nausea. 04/07/13   Rolland Porter, MD  ondansetron (ZOFRAN) 4 MG tablet Take 1 tablet (4 mg total) by mouth every 6 (six) hours. 01/01/15   Rancour, Jeannett Senior, MD   oxyCODONE-acetaminophen (PERCOCET/ROXICET) 5-325 MG per tablet Take 1-2 tablets by mouth every 4 (four) hours as needed (for pain). 04/11/13   Molpus, John, MD  predniSONE (DELTASONE) 20 MG tablet Take 2 tablets (40 mg total) by mouth daily. 06/27/15   Cartner, Sharlet Salina, PA-C  SUMAtriptan (IMITREX) 100 MG tablet Take 100 mg by mouth every 2 (two) hours as needed. For migraine    [provider]  tamsulosin (FLOMAX) 0.4 MG CAPS capsule Take 1 capsule (0.4 mg total) by mouth daily. 04/07/13   Rolland Porter, MD  vitamin B-12 (CYANOCOBALAMIN) 1000 MCG tablet Take 2,000 mcg by mouth daily.     [provider]      Allergies    Tramadol, Milk-related compounds, and Latex    Review of Systems   Review of Systems  Constitutional:  Positive for diaphoresis. Negative for activity change and fever.  HENT:  Negative for facial swelling and trouble swallowing.   Eyes:  Negative for discharge and redness.  Respiratory:  Positive for shortness of breath. Negative for cough.   Cardiovascular:  Positive for chest pain. Negative for palpitations.  Gastrointestinal:  Negative for abdominal pain and nausea.  Genitourinary:  Negative for dysuria and flank pain.  Musculoskeletal:  Negative for back pain and gait problem.  Skin:  Negative for pallor and rash.  Neurological:  Negative for syncope and headaches.    Physical Exam Updated Vital Signs BP 110/82   Pulse 87   Temp 97.7 F (36.5 C) (Oral)   Resp 15   Ht 5' (1.524 m)   Wt 56.2 kg   LMP 01/30/2017   SpO2 99%   BMI 24.22 kg/m  Physical Exam Vitals and nursing note reviewed.  Constitutional:      General: She is not in acute distress.    Appearance: Normal appearance. She is well-developed. She is not ill-appearing, toxic-appearing or diaphoretic.  HENT:     Head: Normocephalic and atraumatic. No raccoon eyes, Battle's sign, right periorbital erythema or left periorbital erythema.     Right Ear: External ear normal.      Left Ear: External ear normal.     Nose: Nose normal.     Mouth/Throat:     Mouth: Mucous membranes are moist.  Eyes:     General: No scleral icterus.       Right eye: No discharge.        Left eye: No discharge.     Extraocular Movements: Extraocular movements intact.     Pupils: Pupils are equal, round, and reactive to light.  Cardiovascular:     Rate and Rhythm: Normal rate and regular rhythm.     Pulses: Normal pulses.     Heart sounds: Normal heart  sounds.  Pulmonary:     Effort: Pulmonary effort is normal. No respiratory distress.     Breath sounds: Normal breath sounds.  Abdominal:     General: Abdomen is flat.     Tenderness: There is no abdominal tenderness.  Musculoskeletal:        General: Normal range of motion.     Cervical back: Full passive range of motion without pain and normal range of motion.     Right lower leg: No edema.     Left lower leg: No edema.  Skin:    General: Skin is warm and dry.     Capillary Refill: Capillary refill takes less than 2 seconds.  Neurological:     Mental Status: She is alert and oriented to person, place, and time.     GCS: GCS eye subscore is 4. GCS verbal subscore is 5. GCS motor subscore is 6.     Cranial Nerves: Cranial nerves 2-12 are intact. No dysarthria.     Sensory: Sensation is intact.     Motor: Motor function is intact.     Coordination: Coordination is intact.     Gait: Gait is intact.     Comments: Variable neurologic exam, inconsistent   Psychiatric:        Mood and Affect: Mood normal.        Behavior: Behavior normal.     ED Results / Procedures / Treatments   Labs (all labs ordered are listed, but only abnormal results are displayed) Labs Reviewed  BASIC METABOLIC PANEL - Abnormal; Notable for the following components:      Result Value   Potassium 3.3 (*)    All other components within normal limits  CBC  BRAIN NATRIURETIC PEPTIDE  TROPONIN I (HIGH SENSITIVITY)  TROPONIN I (HIGH SENSITIVITY)     EKG EKG Interpretation  Date/Time:  Thursday October 28 2021 20:18:04 EDT Ventricular Rate:  83 PR Interval:  149 QRS Duration: 90 QT Interval:  395 QTC Calculation: 465 R Axis:   28 Text Interpretation: Sinus rhythm Low voltage, precordial leads Abnormal R-wave progression, early transition similar to prior no stemi Confirmed by Wynona Dove ([id number]) on 10/28/2021 8:22:11 PM  Radiology DG Chest Port 1 View  Result Date: 10/28/2021 CLINICAL DATA:  Chest pain and left upper extremity weakness. EXAM: PORTABLE CHEST 1 VIEW COMPARISON:  Portable chest 02/18/2017. FINDINGS: The lungs expiratory but generally clear with limited view of the bases. No pleural effusion is seen. Heart size and vasculature are normal. Calcified lymph nodes are again noted in the AP window. The mediastinum is normally outlined. Thoracic cage grossly intact. IMPRESSION: Expiratory chest study negative for visible acute process. Limited view of the bases. Electronically Signed   By: Telford Nab M.D.   On: 10/28/2021 20:36    Procedures Procedures    Medications Ordered in ED Medications  potassium chloride SA (KLOR-CON M) CR tablet 40 mEq (40 mEq Oral Given 10/28/21 2103)  magnesium sulfate IVPB 1 g 100 mL (0 g Intravenous Stopped 10/28/21 2203)    ED Course/ Medical Decision Making/ A&P                           Medical Decision Making Amount and/or Complexity of Data Reviewed Labs: ordered. Radiology: ordered.  Risk Prescription drug management.    CC: Chest pain, tingling, dyspnea, diaphoresis  This patient presents to the Emergency Department for the above complaint. This involves an extensive number  of treatment options and is a complaint that carries with it a high risk of complications and morbidity. Vital signs were reviewed. Serious etiologies considered.  Differential includes all life-threatening causes for chest pain. This includes but is not exclusive to acute coronary syndrome, aortic  dissection, pulmonary embolism, cardiac tamponade, community-acquired pneumonia, pericarditis, musculoskeletal chest wall pain, etc.  Record review:  Previous records obtained and reviewed prior office notes, prior labs and imaging, home medications  Additional history obtained from [place] and surgical history as noted above.   Work up as above, notable for:  Labs & imaging results that were available during my care of the patient were visualized by me and considered in my medical decision making.   I ordered imaging studies which included chest x-ray. I visualized the imaging, interpreted images, and I agree with radiologist interpretation.  No acute process  Cardiac monitoring reviewed and interpreted personally which shows NSR  Labs reviewed, mild hypokalemia.  Otherwise laboratory valuation is unremarkable.  Management: K replace orally.  ED Course:     Reassessment:  Symptoms have resolved.   Admission was considered.   She has mild hypok, reports she will sometimes have these episodes when her potassium is low. Advised her to f/u with her pcp for rpt K check in the next 3 days   The patient's chest pain is not suggestive of pulmonary embolus, cardiac ischemia, aortic dissection, pericarditis, myocarditis, pulmonary embolism, pneumothorax, pneumonia, Zoster, or esophageal perforation, or other serious etiology.  Historically not abrupt in onset, tearing or ripping, pulses symmetric. EKG nonspecific for ischemia/infarction. No dysrhythmias, brugada, WPW, prolonged QT noted.   Troponin negative x1 initially. CXR reviewed. Labs without demonstration of acute pathology unless otherwise noted above. She is pending delta trop but at this point her HEAR score is low. CP appears atypical.   Signed out to incoming EDP pending delta trop, if this is neg would favor discharge. Pt is HDS. Currently asymptomatic.          Social determinants of health include -  Social  History   Socioeconomic History   Marital status: Single    Spouse name: Not on file   Number of children: Not on file   Years of education: Not on file   Highest education level: Not on file  Occupational History   Not on file  Tobacco Use   Smoking status: Never   Smokeless tobacco: Never  Substance and Sexual Activity   Alcohol use: No   Drug use: No   Sexual activity: Yes    Birth control/protection: None  Other Topics Concern   Not on file  Social History Narrative   Not on file   Social Determinants of Health   Financial Resource Strain: Not on file  Food Insecurity: Not on file  Transportation Needs: Not on file  Physical Activity: Not on file  Stress: Not on file  Social Connections: Not on file  Intimate Partner Violence: Not on file      This chart was dictated using voice recognition software.  Despite best efforts to proofread,  errors can occur which can change the documentation meaning.         Final Clinical Impression(s) / ED Diagnoses Final diagnoses:  Atypical chest pain  Hypokalemia    Rx / DC Orders ED Discharge Orders     None         Jeanell Sparrow, DO 10/28/21 2313

## 2023-06-16 ENCOUNTER — Emergency Department (HOSPITAL_BASED_OUTPATIENT_CLINIC_OR_DEPARTMENT_OTHER)
Admission: EM | Admit: 2023-06-16 | Discharge: 2023-06-16 | Disposition: A | Discharge status: Home / Self Care | Payer: Medicaid Other

## 2023-06-16 ENCOUNTER — Encounter (HOSPITAL_BASED_OUTPATIENT_CLINIC_OR_DEPARTMENT_OTHER): Payer: Self-pay

## 2023-06-16 ENCOUNTER — Other Ambulatory Visit: Payer: Self-pay

## 2023-06-16 DIAGNOSIS — Z91040 Latex allergy status: Secondary | ICD-10-CM | POA: Diagnosis not present

## 2023-06-16 DIAGNOSIS — G43809 Other migraine, not intractable, without status migrainosus: Secondary | ICD-10-CM | POA: Diagnosis not present

## 2023-06-16 DIAGNOSIS — Z7951 Long term (current) use of inhaled steroids: Secondary | ICD-10-CM | POA: Insufficient documentation

## 2023-06-16 DIAGNOSIS — R519 Headache, unspecified: Secondary | ICD-10-CM | POA: Diagnosis present

## 2023-06-16 DIAGNOSIS — J45909 Unspecified asthma, uncomplicated: Secondary | ICD-10-CM | POA: Diagnosis not present

## 2023-06-16 MED ORDER — DEXAMETHASONE SODIUM PHOSPHATE 10 MG/ML IJ SOLN
10.0000 mg | Freq: Once | INTRAMUSCULAR | Status: AC
  Administered 2023-06-16: 10 mg via INTRAVENOUS
  Filled 2023-06-16: qty 1

## 2023-06-16 MED ORDER — SODIUM CHLORIDE 0.9 % IV SOLN
12.5000 mg | Freq: Once | INTRAVENOUS | Status: AC
  Administered 2023-06-16: 12.5 mg via INTRAVENOUS
  Filled 2023-06-16: qty 0.5

## 2023-06-16 MED ORDER — KETOROLAC TROMETHAMINE 15 MG/ML IJ SOLN
15.0000 mg | Freq: Once | INTRAMUSCULAR | Status: AC
  Administered 2023-06-16: 15 mg via INTRAVENOUS
  Filled 2023-06-16: qty 1

## 2023-06-16 MED ORDER — PROMETHAZINE HCL 25 MG/ML IJ SOLN
INTRAMUSCULAR | Status: AC
  Filled 2023-06-16: qty 1

## 2023-06-16 MED ORDER — DIPHENHYDRAMINE HCL 50 MG/ML IJ SOLN
25.0000 mg | Freq: Once | INTRAMUSCULAR | Status: AC
  Administered 2023-06-16: 25 mg via INTRAVENOUS
  Filled 2023-06-16: qty 1

## 2023-06-16 MED ORDER — DIVALPROEX SODIUM 500 MG PO DR TAB: 500.0000 mg | DELAYED_RELEASE_TABLET | Freq: Every evening | ORAL | 0 refills | Status: AC

## 2023-06-16 NOTE — ED Provider Notes (Signed)
[place] EMERGENCY DEPARTMENT AT [facility] Provider Note   CSN: [id number] Arrival date & time: 06/16/23  1113     History  Chief Complaint  Patient presents with   Migraine    Dynasia Kercheval is a 43 y.o. female.   Migraine   43 year old female presents emergency department complaints of headache.  Headache present for the past 2 weeks.  Patient reports history of migraine of which she is on Depakote daily as preventative medication.  States he has been out of this medication for the past 2 weeks.  States that headache has been constant.  Has tried Imitrex without improvement of symptoms.  Describes headache is behind her left eye causing some photosensitivity as well as left-sided blurry vision.  States that this is typical of her migraines in the past.  Usually goes to the urgent care and gets IM shots of Phenergan, Toradol, Decadron but the urgent care did not have an opening per patient prompting her visit to the emergency department.  Denies any fever, neck stiffness/rigidity, weakness/sensory deficits unable extremity, slurred speech, facial droop, gait abnormalities.  Does report upcoming appointment with neurology within the next 1 to 2 months.  Requesting medication refill as well.  Past medical history significant for IBS, migraine, TIA, ADHD, asthma, anemia, degenerative disc disease, chronic back and neck pain.  Home Medications Prior to Admission medications   Medication Sig Start Date End Date Taking? Authorizing Provider  albuterol (PROVENTIL HFA;VENTOLIN HFA) 108 (90 BASE) MCG/ACT inhaler Inhale 2 puffs into the lungs every 4 (four) hours as needed. For cough or wheezing   Yes [provider]  ALPRAZolam (XANAX) 0.25 MG tablet Take 0.25 mg by mouth 2 (two) times daily as needed for anxiety.   Yes [provider]  amphetamine-dextroamphetamine (ADDERALL) 30 MG tablet Take 30 mg by mouth 3 (three) times daily.   Yes [provider]   budesonide-formoterol (SYMBICORT) 160-4.5 MCG/ACT inhaler Inhale 2 puffs into the lungs 2 (two) times daily.   Yes [provider]  divalproex (DEPAKOTE) 500 MG DR tablet Take 500 mg by mouth at bedtime.   Yes [provider]  divalproex (DEPAKOTE) 500 MG DR tablet Take 1 tablet (500 mg total) by mouth at bedtime. 06/16/23  Yes Sherian Maroon A, PA  FLUoxetine (PROZAC) 40 MG capsule Take 40 mg by mouth daily.   Yes [provider]  LORazepam (ATIVAN) 1 MG tablet Take 1 mg by mouth at bedtime.   Yes [provider]  omeprazole (PRILOSEC) 40 MG capsule Take 40 mg by mouth at bedtime.   Yes [provider]  ondansetron (ZOFRAN ODT) 4 MG disintegrating tablet Take 1 tablet (4 mg total) by mouth every 8 (eight) hours as needed for nausea. 04/07/13  Yes Rolland Porter, MD  ciprofloxacin (CIPRO) 500 MG tablet Take 1 tablet (500 mg total) by mouth every 12 (twelve) hours. 04/07/13   Rolland Porter, MD  cyanocobalamin (,VITAMIN B-12,) 1000 MCG/ML injection Inject 1,000 mcg into the muscle every 30 (thirty) days.    [provider]  desvenlafaxine (PRISTIQ) 50 MG 24 hr tablet Take 50 mg by mouth daily.    [provider]  DULoxetine (CYMBALTA) 20 MG capsule Take 60 mg by mouth daily.     [provider]  Fluticasone-Salmeterol (ADVAIR) 250-50 MCG/DOSE AEPB Inhale 1 puff into the lungs every 12 (twelve) hours.    [provider]  HYDROcodone-acetaminophen (NORCO/VICODIN) 5-325 MG per tablet Take 1 tablet by mouth every  4 (four) hours as needed. 01/01/15   Rancour, Jeannett Senior, MD  ibuprofen (ADVIL,MOTRIN) 800 MG tablet Take 1 tablet (800 mg total) by mouth 3 (three) times daily. 01/01/15   Rancour, Jeannett Senior, MD  ketorolac (TORADOL) 10 MG tablet Take 1 tablet (10 mg total) by mouth every 6 (six) hours as needed. 06/27/15   Cartner, Sharlet Salina, PA-C  methocarbamol (ROBAXIN) 750 MG tablet Take 750 mg by mouth 4 (four) times daily.    [provider]  methylphenidate (RITALIN) 10 MG tablet Take 10 mg by mouth 3 (three) times daily as needed. For concentration    [provider]  nortriptyline (PAMELOR) 10 MG capsule Take 20 mg by mouth at bedtime.    [provider]  ondansetron (ZOFRAN) 4 MG tablet Take 1 tablet (4 mg total) by mouth every 6 (six) hours. 01/01/15   Rancour, Jeannett Senior, MD  oxyCODONE-acetaminophen (PERCOCET/ROXICET) 5-325 MG per tablet Take 1-2 tablets by mouth every 4 (four) hours as needed (for pain). 04/11/13   Molpus, John, MD  predniSONE (DELTASONE) 20 MG tablet Take 2 tablets (40 mg total) by mouth daily. 06/27/15   Cartner, Sharlet Salina, PA-C  SUMAtriptan (IMITREX) 100 MG tablet Take 50 mg by mouth every 2 (two) hours as needed for migraine. For migraine    [provider]  tamsulosin (FLOMAX) 0.4 MG CAPS capsule Take 1 capsule (0.4 mg total) by mouth daily. 04/07/13   Rolland Porter, MD  vitamin B-12 (CYANOCOBALAMIN) 1000 MCG tablet Take 2,000 mcg by mouth daily.     [provider]      Allergies    Tramadol, Milk-related compounds, Topamax [topiramate], and Latex    Review of Systems   Review of Systems  All other systems reviewed and are negative.   Physical Exam Updated Vital Signs BP 104/80 (BP Location: Right Arm)   Pulse 81   Temp 98 F (36.7 C) (Oral)   Resp 16   Ht 4\' 10"  (1.473 m)   Wt 51.3 kg   LMP 01/30/2017   SpO2 100%   BMI 23.62 kg/m  Physical Exam Vitals and nursing note reviewed.  Constitutional:      General: She is not in acute distress.    Appearance: She is well-developed.  HENT:     Head: Normocephalic and atraumatic.  Eyes:     Conjunctiva/sclera: Conjunctivae normal.  Cardiovascular:     Rate and Rhythm: Normal rate and regular rhythm.     Heart sounds: No murmur heard. Pulmonary:     Effort: Pulmonary effort is normal. No respiratory distress.     Breath sounds: Normal breath sounds.  Abdominal:     Palpations: Abdomen is soft.      Tenderness: There is no abdominal tenderness.  Musculoskeletal:        General: No swelling.     Cervical back: Neck supple. No rigidity or tenderness.  Skin:    General: Skin is warm and dry.     Capillary Refill: Capillary refill takes less than 2 seconds.  Neurological:     Mental Status: She is alert.     Comments: Alert and oriented to self, place, time and event.   Speech is fluent, clear without dysarthria or dysphasia.   Strength 5/5 in upper/lower extremities   Sensation intact in upper/lower extremities   Normal gait.  CN I not tested  CN II grossly intact visual fields bilaterally. Did not visualize posterior eye.  CN III, IV, VI PERRLA and EOMs intact bilaterally  CN V Intact sensation to sharp and light touch to the face  CN VII facial movements symmetric  CN VIII not tested  CN IX, X no uvula deviation, symmetric rise of soft palate  CN XI 5/5 SCM and trapezius strength bilaterally  CN XII Midline tongue protrusion, symmetric L/R movements     Psychiatric:        Mood and Affect: Mood normal.     ED Results / Procedures / Treatments   Labs (all labs ordered are listed, but only abnormal results are displayed) Labs Reviewed - No data to display  EKG None  Radiology No results found.  Procedures Procedures    Medications Ordered in ED Medications  ketorolac (TORADOL) 15 MG/ML injection 15 mg (has no administration in time range)  diphenhydrAMINE (BENADRYL) injection 25 mg (has no administration in time range)  promethazine (PHENERGAN) 12.5 mg in sodium chloride 0.9 % 50 mL IVPB (has no administration in time range)  dexamethasone (DECADRON) injection 10 mg (has no administration in time range)    ED Course/ Medical Decision Making/ A&P                                 Medical Decision Making Risk Prescription drug management.   This patient presents to the ED for concern of headache, this involves an extensive number of treatment  options, and is a complaint that carries with it a high risk of complications and morbidity.  The differential diagnosis includes migraine/tension/cluster headache, GCA, meningitis/encephalitis, carotid artery/vertebral artery dissection, cerebral venous thrombosis, tumor, other   Co morbidities that complicate the patient evaluation  See HPI   Additional history obtained:  Additional history obtained from EMR External records from outside source obtained and reviewed including hospital records   Lab Tests:  N/a   Imaging Studies ordered:  N/a   Cardiac Monitoring: / EKG:  The patient was maintained on a cardiac monitor.  I personally viewed and interpreted the cardiac monitored which showed an underlying rhythm of: sinus rhythm   Consultations Obtained:  N/a   Problem List / ED Course / Critical interventions / Medication management  headache I ordered medication including Toradol, Decadron, Benadryl, Phenergan   Reevaluation of the patient after these medicines showed that the patient improved I have reviewed the patients home medicines and have made adjustments as needed   Social Determinants of Health:  Denies to, licit drug use.   Test / Admission - Considered:  Bad headache Vitals signs within normal range and stable throughout visit. 43 year old female presents emergency department complaints of headache.  Patient with history of migraines on preventative Depakote but has been without her medication for the past 2 weeks.  Reports 2-week headache left-sided retro-orbital causing some of the sensitivity as well as blurry vision in her left eye.  Patient reports history of headaches being "the exact same".  On exam, nonfocal neuroexam.  No clinical evidence of meningismus.  Laboratory studies/imaging studies considered but deemed unnecessary given reported history of similar headaches in the past related to her migraines without any acute change at all.   Symptoms been likely secondary to migraine.  Treated with migraine cocktail and did note improvement.  Will refill patient's Depakote and recommend follow-up with neurology in the outpatient setting.  Treatment plan discussed at length with patient and she acknowledged understanding was agreeable to said plan.  Patient overall well-appearing, afebrile in no acute distress. Worrisome signs  and symptoms were discussed with the patient, and the patient acknowledged understanding to return to the ED if noticed. Patient was stable upon discharge.          Final Clinical Impression(s) / ED Diagnoses Final diagnoses:  Other migraine without status migrainosus, not intractable    Rx / DC Orders ED Discharge Orders          Ordered    divalproex (DEPAKOTE) 500 MG DR tablet  Nightly        06/16/23 1229              Peter Garter, [place] 06/16/23 1240    Coral Spikes, DO 06/16/23 1256

## 2023-06-16 NOTE — Discharge Instructions (Signed)
As discussed, we will refill your at home Depakote.  Recommend follow-up with neurology in the outpatient setting for continued prescription.  Please do not hesitate to return to emergency department if the worrisome signs and symptoms we discussed become apparent.

## 2023-06-16 NOTE — ED Triage Notes (Signed)
In for eval of migraine onset 2 weeks ago. Photosensitive. Nausea. Denies vomiting. Out of medication Depakote. Feels like her normal migraines.

## 2023-06-16 NOTE — ED Notes (Signed)
Reviewed discharge instructions, follow up and medication with pt. Pt states understanding. Pain controlled at time of discharge. Ambulatory with family at time of discharge

## 2023-09-29 ENCOUNTER — Other Ambulatory Visit: Payer: Self-pay | Admitting: Medical Genetics

## 2024-02-22 ENCOUNTER — Other Ambulatory Visit: Payer: Self-pay | Admitting: Medical Genetics

## 2024-02-22 DIAGNOSIS — Z006 Encounter for examination for normal comparison and control in clinical research program: Secondary | ICD-10-CM

## 2024-04-09 LAB — GENECONNECT MOLECULAR SCREEN: Genetic Analysis Overall Interpretation: NEGATIVE
